# Patient Record
Sex: Male | Born: 1955 | Race: Black or African American | Hispanic: No | Marital: Married | Smoking: Former smoker
Health system: Southern US, Community
[De-identification: ages and names within clinical notes are randomized; demographics above are authoritative.]

## PROBLEM LIST (undated history)

## (undated) DIAGNOSIS — F528 Other sexual dysfunction not due to a substance or known physiological condition: Secondary | ICD-10-CM

## (undated) DIAGNOSIS — J45909 Unspecified asthma, uncomplicated: Secondary | ICD-10-CM

## (undated) DIAGNOSIS — E785 Hyperlipidemia, unspecified: Secondary | ICD-10-CM

## (undated) DIAGNOSIS — H409 Unspecified glaucoma: Secondary | ICD-10-CM

## (undated) DIAGNOSIS — I10 Essential (primary) hypertension: Secondary | ICD-10-CM

## (undated) HISTORY — DX: Unspecified glaucoma: H40.9

## (undated) HISTORY — DX: Essential (primary) hypertension: I10

## (undated) HISTORY — DX: Hyperlipidemia, unspecified: E78.5

## (undated) HISTORY — DX: Unspecified asthma, uncomplicated: J45.909

## (undated) HISTORY — PX: CATARACT EXTRACTION: SUR2

## (undated) HISTORY — DX: Other sexual dysfunction not due to a substance or known physiological condition: F52.8

---

## 1998-04-22 ENCOUNTER — Ambulatory Visit (HOSPITAL_COMMUNITY): Admission: RE | Admit: 1998-04-22 | Discharge: 1998-04-22 | Payer: Self-pay | Admitting: Internal Medicine

## 2006-02-26 ENCOUNTER — Inpatient Hospital Stay (HOSPITAL_COMMUNITY): Admission: EM | Admit: 2006-02-26 | Discharge: 2006-02-27 | Payer: Self-pay | Admitting: Emergency Medicine

## 2006-02-26 ENCOUNTER — Ambulatory Visit: Payer: Self-pay | Admitting: Internal Medicine

## 2006-03-05 ENCOUNTER — Ambulatory Visit: Payer: Self-pay | Admitting: Internal Medicine

## 2006-04-20 ENCOUNTER — Ambulatory Visit: Payer: Self-pay | Admitting: Internal Medicine

## 2006-04-20 LAB — CONVERTED CEMR LAB: PSA: 1.03 ng/mL

## 2006-04-27 ENCOUNTER — Ambulatory Visit: Payer: Self-pay | Admitting: Internal Medicine

## 2006-05-08 ENCOUNTER — Ambulatory Visit: Payer: Self-pay | Admitting: Internal Medicine

## 2006-06-13 ENCOUNTER — Ambulatory Visit: Payer: Self-pay | Admitting: Internal Medicine

## 2006-06-13 ENCOUNTER — Encounter (INDEPENDENT_AMBULATORY_CARE_PROVIDER_SITE_OTHER): Payer: Self-pay | Admitting: *Deleted

## 2006-07-22 ENCOUNTER — Emergency Department (HOSPITAL_COMMUNITY): Admission: EM | Admit: 2006-07-22 | Discharge: 2006-07-22 | Payer: Self-pay | Admitting: Family Medicine

## 2006-07-27 ENCOUNTER — Ambulatory Visit: Payer: Self-pay | Admitting: Internal Medicine

## 2007-04-11 ENCOUNTER — Ambulatory Visit: Payer: Self-pay | Admitting: Internal Medicine

## 2007-04-11 LAB — CONVERTED CEMR LAB
ALT: 24 units/L (ref 0–53)
AST: 29 units/L (ref 0–37)
Albumin: 3.9 g/dL (ref 3.5–5.2)
Basophils Relative: 0.4 % (ref 0.0–1.0)
Bilirubin Urine: NEGATIVE
CO2: 31 meq/L (ref 19–32)
Calcium: 9.6 mg/dL (ref 8.4–10.5)
Cholesterol: 159 mg/dL (ref 0–200)
Creatinine, Ser: 0.9 mg/dL (ref 0.4–1.5)
Eosinophils Absolute: 0.1 10*3/uL (ref 0.0–0.6)
GFR calc non Af Amer: 95 mL/min
HCT: 44.2 % (ref 39.0–52.0)
Hemoglobin, Urine: NEGATIVE
Ketones, ur: NEGATIVE mg/dL
LDL Cholesterol: 96 mg/dL (ref 0–99)
Leukocytes, UA: NEGATIVE
MCV: 85.7 fL (ref 78.0–100.0)
Monocytes Relative: 10.4 % (ref 3.0–11.0)
Neutro Abs: 1.6 10*3/uL (ref 1.4–7.7)
Nitrite: NEGATIVE
PSA: 4.83 ng/mL — ABNORMAL HIGH (ref 0.10–4.00)
Potassium: 4.4 meq/L (ref 3.5–5.1)
RDW: 12.1 % (ref 11.5–14.6)
Sodium: 143 meq/L (ref 135–145)
Total Bilirubin: 0.7 mg/dL (ref 0.3–1.2)
Total CHOL/HDL Ratio: 4
Total Protein: 6.7 g/dL (ref 6.0–8.3)
Urine Glucose: NEGATIVE mg/dL
VLDL: 23 mg/dL (ref 0–40)
pH: 6 (ref 5.0–8.0)

## 2007-04-12 ENCOUNTER — Encounter: Payer: Self-pay | Admitting: Internal Medicine

## 2007-04-12 DIAGNOSIS — M545 Low back pain, unspecified: Secondary | ICD-10-CM | POA: Insufficient documentation

## 2007-04-12 DIAGNOSIS — J45909 Unspecified asthma, uncomplicated: Secondary | ICD-10-CM | POA: Insufficient documentation

## 2007-04-12 DIAGNOSIS — H409 Unspecified glaucoma: Secondary | ICD-10-CM | POA: Insufficient documentation

## 2007-04-12 DIAGNOSIS — E785 Hyperlipidemia, unspecified: Secondary | ICD-10-CM | POA: Insufficient documentation

## 2007-04-12 DIAGNOSIS — F528 Other sexual dysfunction not due to a substance or known physiological condition: Secondary | ICD-10-CM

## 2007-04-12 HISTORY — DX: Unspecified asthma, uncomplicated: J45.909

## 2007-04-12 HISTORY — DX: Unspecified glaucoma: H40.9

## 2007-04-12 HISTORY — DX: Other sexual dysfunction not due to a substance or known physiological condition: F52.8

## 2007-04-12 HISTORY — DX: Hyperlipidemia, unspecified: E78.5

## 2008-05-28 ENCOUNTER — Ambulatory Visit: Payer: Self-pay | Admitting: Internal Medicine

## 2008-05-28 DIAGNOSIS — I10 Essential (primary) hypertension: Secondary | ICD-10-CM | POA: Insufficient documentation

## 2008-05-28 HISTORY — DX: Essential (primary) hypertension: I10

## 2008-06-22 ENCOUNTER — Ambulatory Visit: Payer: Self-pay | Admitting: Internal Medicine

## 2008-06-23 LAB — CONVERTED CEMR LAB
AST: 30 units/L (ref 0–37)
Alkaline Phosphatase: 57 units/L (ref 39–117)
Basophils Absolute: 0 10*3/uL (ref 0.0–0.1)
Basophils Relative: 0.7 % (ref 0.0–3.0)
Bilirubin Urine: NEGATIVE
Calcium: 8.9 mg/dL (ref 8.4–10.5)
Creatinine, Ser: 1.2 mg/dL (ref 0.4–1.5)
Eosinophils Absolute: 0.1 10*3/uL (ref 0.0–0.7)
Eosinophils Relative: 3 % (ref 0.0–5.0)
GFR calc Af Amer: 82 mL/min
GFR calc non Af Amer: 68 mL/min
Hemoglobin, Urine: NEGATIVE
Hemoglobin: 15.4 g/dL (ref 13.0–17.0)
Ketones, ur: NEGATIVE mg/dL
LDL Cholesterol: 108 mg/dL — ABNORMAL HIGH (ref 0–99)
Leukocytes, UA: NEGATIVE
MCHC: 34.6 g/dL (ref 30.0–36.0)
Neutro Abs: 2.2 10*3/uL (ref 1.4–7.7)
Nitrite: NEGATIVE
Potassium: 3.9 meq/L (ref 3.5–5.1)
RDW: 12.4 % (ref 11.5–14.6)
Total CHOL/HDL Ratio: 3.9
Total Protein: 6.2 g/dL (ref 6.0–8.3)
Urine Glucose: NEGATIVE mg/dL
Urobilinogen, UA: 0.2 (ref 0.0–1.0)
VLDL: 27 mg/dL (ref 0–40)

## 2009-06-15 ENCOUNTER — Ambulatory Visit: Payer: Self-pay | Admitting: Internal Medicine

## 2009-06-15 DIAGNOSIS — H6500 Acute serous otitis media, unspecified ear: Secondary | ICD-10-CM

## 2009-07-06 ENCOUNTER — Ambulatory Visit: Payer: Self-pay | Admitting: Internal Medicine

## 2009-07-06 LAB — CONVERTED CEMR LAB
ALT: 30 units/L (ref 0–53)
Alkaline Phosphatase: 84 units/L (ref 39–117)
Basophils Absolute: 0 10*3/uL (ref 0.0–0.1)
Bilirubin, Direct: 0 mg/dL (ref 0.0–0.3)
CO2: 30 meq/L (ref 19–32)
Calcium: 9.3 mg/dL (ref 8.4–10.5)
Chloride: 104 meq/L (ref 96–112)
Creatinine, Ser: 1 mg/dL (ref 0.4–1.5)
Glucose, Bld: 104 mg/dL — ABNORMAL HIGH (ref 70–99)
HCT: 46.5 % (ref 39.0–52.0)
HDL: 46.5 mg/dL (ref >39.00)
Hemoglobin, Urine: NEGATIVE
Ketones, ur: NEGATIVE mg/dL
Leukocytes, UA: NEGATIVE
Lymphs Abs: 1.4 10*3/uL (ref 0.7–4.0)
MCV: 89.1 fL (ref 78.0–100.0)
Monocytes Absolute: 0.5 10*3/uL (ref 0.1–1.0)
Monocytes Relative: 10.1 % (ref 3.0–12.0)
PSA: 2.49 ng/mL (ref 0.10–4.00)
Potassium: 4 meq/L (ref 3.5–5.1)
RDW: 12.6 % (ref 11.5–14.6)
Sodium: 141 meq/L (ref 135–145)
Specific Gravity, Urine: 1.02 (ref 1.000–1.030)
Total CHOL/HDL Ratio: 4
Total Protein, Urine: NEGATIVE mg/dL
Triglycerides: 71 mg/dL (ref 0.0–149.0)
VLDL: 14.2 mg/dL (ref 0.0–40.0)
WBC: 4.7 10*3/uL (ref 4.5–10.5)
pH: 5.5 (ref 5.0–8.0)

## 2009-07-13 ENCOUNTER — Ambulatory Visit: Payer: Self-pay | Admitting: Internal Medicine

## 2010-06-13 ENCOUNTER — Ambulatory Visit: Payer: Self-pay | Admitting: Internal Medicine

## 2010-06-13 DIAGNOSIS — M25559 Pain in unspecified hip: Secondary | ICD-10-CM

## 2010-06-13 LAB — CONVERTED CEMR LAB
ALT: 25 units/L (ref 0–53)
AST: 26 units/L (ref 0–37)
Alkaline Phosphatase: 72 units/L (ref 39–117)
Bilirubin, Direct: 0.1 mg/dL (ref 0.0–0.3)
CO2: 27 meq/L (ref 19–32)
Calcium: 9.4 mg/dL (ref 8.4–10.5)
Chloride: 104 meq/L (ref 96–112)
Cholesterol: 178 mg/dL (ref 0–200)
Eosinophils Absolute: 0.1 10*3/uL (ref 0.0–0.7)
Eosinophils Relative: 2.6 % (ref 0.0–5.0)
GFR calc non Af Amer: 104.72 mL/min (ref >60)
HDL: 48.2 mg/dL (ref >39.00)
Hemoglobin: 16 g/dL (ref 13.0–17.0)
MCHC: 34.4 g/dL (ref 30.0–36.0)
MCV: 86.7 fL (ref 78.0–100.0)
Monocytes Absolute: 0.6 10*3/uL (ref 0.1–1.0)
Neutro Abs: 2.1 10*3/uL (ref 1.4–7.7)
Nitrite: NEGATIVE
PSA: 2.68 ng/mL (ref 0.10–4.00)
Platelets: 196 10*3/uL (ref 150.0–400.0)
TSH: 1.11 microintl units/mL (ref 0.35–5.50)
Total CHOL/HDL Ratio: 4
Total Protein, Urine: NEGATIVE mg/dL
Total Protein: 7.1 g/dL (ref 6.0–8.3)
WBC: 4.8 10*3/uL (ref 4.5–10.5)
pH: 6 (ref 5.0–8.0)

## 2010-09-22 NOTE — Assessment & Plan Note (Signed)
Summary: OV--PER PT D/T---STC   Vital Signs:  Patient profile:   55 year old male Height:      71.5 inches Weight:      232.50 pounds BMI:     32.09 O2 Sat:      97 % on Room air Temp:     98.5 degrees F oral Pulse rate:   74 / minute BP sitting:   150 / 88  (left arm) Cuff size:   large  Vitals Entered By: Zella Ball Ewing CMA Duncan Dull) (June 13, 2010 10:27 AM)  O2 Flow:  Room air  Preventive Care Screening  Colonoscopy:    Next Due:  06/2011     decline flu shot  CC: followup/RE   CC:  followup/RE.  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  out of BP meds for 2 wks;  overall gained 5 lbs in 2 yrs;  Pt denies CP, worsening sob, doe, wheezing, orthopnea, pnd, worsening LE edema, palps, dizziness or syncope  Pt denies new neuro symptoms such as headache, facial or extremity weakness  No fever, wt loss, night sweats, loss of appetite or other constitutional symptoms  Pt denies polydipsia, polyuria, Overall good compliance with meds, trying to follow low chol  diet, wt stable, little excercise however , except for work. Pt states good ability with ADL's, low fall risk, home safety reviewed and adequate, no significant change in hearing or vision, trying to follow lower chol diet, and occasionally active only with regular excercise.   Preventive Screening-Counseling & Management      Drug Use:  no.    Problems Prior to Update: 1)  Hip Pain, Left  (ICD-719.45) 2)  Preventive Health Care  (ICD-V70.0) 3)  Otitis Media, Serous, Acute, Right  (ICD-381.01) 4)  Hypertension  (ICD-401.9) 5)  Low Back Pain  (ICD-724.2) 6)  Hyperlipidemia  (ICD-272.4) 7)  Asthma  (ICD-493.90) 8)  Erectile Dysfunction  (ICD-302.72) 9)  Glaucoma Nos  (ICD-365.9)  Medications Prior to Update: 1)   Amlodipine Besylate 10 Mg Tabs (Amlodipine Besylate) .Marland Kitchen.. 1 By Mouth Once Daily 2)  Adult Aspirin Ec Low Strength 81 Mg Tbec (Aspirin) .Marland Kitchen.. 1 By Mouth Once Daily 3)  Viagra 100 Mg Tabs (Sildenafil Citrate) .Marland Kitchen.. 1 By Mouth Once Daily As Needed  Current Medications (verified): 1)  Amlodipine Besylate 10 Mg Tabs (Amlodipine Besylate) .Marland Kitchen.. 1 By Mouth Once Daily 2)  Adult Aspirin Ec Low Strength 81 Mg Tbec (Aspirin) .Marland Kitchen.. 1 By Mouth Once Daily 3)  Levitra 20 Mg Tabs (Vardenafil Hcl) .Marland Kitchen.. 1 By Mouth Every Other Day As Needed 4)  Tramadol Hcl 50 Mg Tabs (Tramadol Hcl) .Marland Kitchen.. 1 - 2 By Mouth Q 6 Hrs As Needed Pain  Allergies (verified): 1)  ! Pcn  Past History:  Past Medical History: Last updated: 05/28/2008 Glaucoma Erectile Dysfunction Asthma Hyperlipidemia Low back pain Hypertension  Past Surgical History: Last updated: 04/12/2007 L Lower Leg L Hip REpair- Pin Placement Eye Surgery X3  Family History: Last updated: 07/13/2009 mother with breast cancer grandfather with bleeding ulcers  Social History: Last updated: 06/13/2010 Married Alcohol use-yes Former Smoker 2 biological children work - Oncologist  - Psychologist, sport and exercise Drug use-no  Risk Factors: Smoking Status: quit (07/13/2009) Packs/Day: 1/2 PPD (  04/12/2007)  Family History: Reviewed history from 07/13/2009 and no changes required. mother with breast cancer grandfather with bleeding ulcers  Social History: Reviewed history from 07/13/2009 and no changes required. Married Alcohol use-yes Former Smoker 2 biological children work - Tax adviser Drug use-no Drug Use:  no  Review of Systems  The patient denies anorexia, fever, vision loss, decreased hearing, hoarseness, chest pain, syncope, dyspnea on exertion, peripheral edema, prolonged cough, headaches, hemoptysis, abdominal pain, melena, hematochezia, severe indigestion/heartburn, hematuria, muscle weakness, suspicious skin  lesions, transient blindness, difficulty walking, depression, unusual weight change, abnormal bleeding, enlarged lymph nodes, and angioedema.         all otherwise negative per pt -    Physical Exam  General:  alert and overweight-appearing.   Head:  normocephalic and atraumatic.   Eyes:  vision grossly intact, pupils equal, and pupils round.   Ears:  R ear normal and L ear normal.   Nose:  no external deformity and no nasal discharge.   Mouth:  no gingival abnormalities and pharynx pink and moist.  Neck:  supple and no masses.   Lungs:  normal respiratory effort and normal breath sounds.   Heart:  normal rate and regular rhythm.   Abdomen:  soft, non-tender, and normal bowel sounds.   Msk:  no joint tenderness and no joint swelling.  , has pain to ROM left hip testing Extremities:  no edema, no erythema  Neurologic:  cranial nerves II-XII intact and strength normal in all extremities.  , gait antagic favoring the left leg Skin:  color normal and no rashes.   Psych:  not anxious appearing and not depressed appearing.     Impression & Recommendations:  Problem # 1:  PREVENTIVE HEALTH CARE (ICD-V70.0) Overall doing well, age appropriate education and counseling updated, referral for preventive services and immunizations addressed, dietary counseling and smoking status adressed , most recent labs reviewed, ecg reviewed or declined I have personally reviewed and have noted 1.The patient's medical and social history 2.Their use of alcohol, tobacco or illicit drugs 3.Their current medications and supplements 4. Functional ability including ADL's, fall risk, home safety risk, hearing & visual impairment  5.Diet and physical activities 6.Evidence for depression or mood disorders The patients weight, height, BMI  have been recorded in the chart I have made referrals, counseling and provided education to the patient based review of the above  Orders: TLB-BMP (Basic Metabolic Panel-BMET)  (80048-METABOL) TLB-CBC Platelet - w/Differential (85025-CBCD) TLB-Hepatic/Liver Function Pnl (80076-HEPATIC) TLB-Lipid Panel (80061-LIPID) TLB-PSA (Prostate Specific Antigen) (84153-PSA) TLB-TSH (Thyroid Stimulating Hormone) (84443-TSH) TLB-Udip ONLY (81003-UDIP)  Problem # 2:  HIP PAIN, LEFT (ICD-719.45)  His updated medication list for this problem includes:    Adult Aspirin Ec Low Strength 81 Mg Tbec (Aspirin) .Marland Kitchen... 1 by mouth once daily    Tramadol Hcl 50 Mg Tabs (Tramadol hcl) .Marland Kitchen... 1 - 2 by mouth q 6 hrs as needed pain and prox left ant thigh pain and hx of prior surgury/pins;  incresaed pain recently - for ortho f/u (has seen [place] [facility] in yrs past), pain med refill  Orders: Orthopedic Surgeon Referral (Ortho Surgeon)  Problem # 3:  HYPERTENSION (ICD-401.9)  His updated medication list for this problem includes:    Amlodipine Besylate 10 Mg Tabs (Amlodipine besylate) .Marland Kitchen... 1 by mouth once daily to re-start med, f/u BP at home and next visit  BP today: 150/88 Prior BP: 122/76 (07/13/2009)  Labs Reviewed: K+: 4.0 (07/06/2009) Creat: : 1.0 (  07/06/2009)   Chol: 186 (07/06/2009)   HDL: 46.50 (07/06/2009)   LDL: 125 (07/06/2009)   TG: 71.0 (07/06/2009)  Problem # 4:  ERECTILE DYSFUNCTION (ICD-302.72)  His updated medication list for this problem includes:    Levitra 20 Mg Tabs (Vardenafil hcl) .Marland Kitchen... 1 by mouth every other day as needed treat as above, f/u any worsening signs or symptoms , change to levitra due to cost  Complete Medication List: 1)  Amlodipine Besylate 10 Mg Tabs (Amlodipine besylate) .Marland Kitchen.. 1 by mouth once daily 2)  Adult Aspirin Ec Low Strength 81 Mg Tbec (Aspirin) .Marland Kitchen.. 1 by mouth once daily 3)  Levitra 20 Mg Tabs (Vardenafil hcl) .Marland Kitchen.. 1 by mouth every other day as needed 4)  Tramadol Hcl 50 Mg Tabs (Tramadol hcl) .Marland Kitchen.. 1 - 2 by mouth q 6 hrs as needed pain  Patient Instructions: 1)  Please take all new medications as prescribed - the tramadol for pain, and  levitra (in place of viagra) 2)  Continue all previous medications as before this visit , including re-starting the blood pressure medicine 3)  Please go to the Lab in the basement for your blood and/or urine tests today 4)  Please call the number on the [facility] Card for results of your testing  5)  You will be contacted about the referral(s) to: Orthopedic 6)  Please schedule a follow-up appointment in 1 year, or sooner if needed 7)  Check your Blood Pressure regularly. If it is above 140/90: you should make an appointment, sooner Prescriptions: TRAMADOL HCL 50 MG TABS (TRAMADOL HCL) 1 - 2 by mouth q 6 hrs as needed pain  #240 x 1   Entered and Authorized by:   Corwin Levins MD   Signed by:   Corwin Levins MD on 06/13/2010   Method used:   Print then Give to Patient   RxID:   [id number] AMLODIPINE BESYLATE 10 MG TABS (AMLODIPINE BESYLATE) 1 by mouth once daily  #90 x 3   Entered and Authorized by:   Corwin Levins MD   Signed by:   Corwin Levins MD on 06/13/2010   Method used:   Print then Give to Patient   RxID:   [id number] LEVITRA 20 MG TABS (VARDENAFIL HCL) 1 by mouth every other day as needed  #5 x 11   Entered and Authorized by:   Corwin Levins MD   Signed by:   Corwin Levins MD on 06/13/2010   Method used:   Print then Give to Patient   RxID:   [id number]    Orders Added: 1)  TLB-BMP (Basic Metabolic Panel-BMET) [80048-METABOL] 2)  TLB-CBC Platelet - w/Differential [85025-CBCD] 3)  TLB-Hepatic/Liver Function Pnl [80076-HEPATIC] 4)  TLB-Lipid Panel [80061-LIPID] 5)  TLB-PSA (Prostate Specific Antigen) [66440-HKV] 6)  TLB-TSH (Thyroid Stimulating Hormone) [84443-TSH] 7)  TLB-Udip ONLY [81003-UDIP] 8)  Orthopedic Surgeon Referral [Ortho Surgeon] 9)  Est. Patient 40-64 years [phone]

## 2011-01-06 NOTE — Assessment & Plan Note (Signed)
[facility]                                 ON-CALL NOTE   Kent Thomas, Kent Thomas                     MRN:          [id number]  DATE:07/22/2006                            DOB:          11/01/1955    SUBJECTIVE:  [name] has had a couple of drops of blood in his urine  since Saturday, again recurring today, at the end of his urine stream.  He states it also burns when he urinates.  He denies any fever, chills,  nausea, vomiting.   ASSESSMENT/PLAN:  Possible urinary tract infection.  Recommended  appointment with primary care doctor on Monday.     Kerby Nora, MD  Electronically Signed    AB/MedQ  DD: 07/22/2006  DT: 07/23/2006  Job #: [id number]

## 2011-01-06 NOTE — Assessment & Plan Note (Signed)
[facility]                                 ON-CALL NOTE   ERRIN, CHEWNING                     MRN:          [id number]  DATE:07/22/2006                            DOB:          01-02-1956    Mr. Boehringer called because he started having painless hematuria. He had a  colonoscopy one month ago and wondered if it was related to that. He  does not have a fever or feel unwell otherwise.   I recommended that he call to schedule an appointment with Dr. Oliver Barre, his primary care physician when the office opens on Monday. I told  him this is abnormal but that it is extremely unlikely it is related to  his colonoscopy a month ago.     Iva Boop, MD,FACG  Electronically Signed    CEG/MedQ  DD: 07/22/2006  DT: 07/23/2006  Job #: [id number]   cc:   Corwin Levins, MD
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
## 2011-06-14 ENCOUNTER — Encounter: Payer: Self-pay | Admitting: Internal Medicine

## 2011-07-10 ENCOUNTER — Encounter: Payer: Self-pay | Admitting: Internal Medicine

## 2011-07-10 ENCOUNTER — Telehealth: Payer: Self-pay

## 2011-07-10 ENCOUNTER — Other Ambulatory Visit (INDEPENDENT_AMBULATORY_CARE_PROVIDER_SITE_OTHER): Payer: Self-pay

## 2011-07-10 DIAGNOSIS — Z0000 Encounter for general adult medical examination without abnormal findings: Secondary | ICD-10-CM | POA: Insufficient documentation

## 2011-07-10 DIAGNOSIS — Z1289 Encounter for screening for malignant neoplasm of other sites: Secondary | ICD-10-CM

## 2011-07-10 LAB — CBC WITH DIFFERENTIAL/PLATELET
Basophils Relative: 0.6 % (ref 0.0–3.0)
Eosinophils Relative: 2.3 % (ref 0.0–5.0)
Hemoglobin: 15 g/dL (ref 13.0–17.0)
Lymphs Abs: 1.4 10*3/uL (ref 0.7–4.0)
MCHC: 33 g/dL (ref 30.0–36.0)
MCV: 87.6 fl (ref 78.0–100.0)
Monocytes Relative: 10.2 % (ref 3.0–12.0)
Neutrophils Relative %: 57.1 % (ref 43.0–77.0)
Platelets: 200 10*3/uL (ref 150.0–400.0)
RDW: 13.6 % (ref 11.5–14.6)
WBC: 4.7 10*3/uL (ref 4.5–10.5)

## 2011-07-10 LAB — URINALYSIS, ROUTINE W REFLEX MICROSCOPIC
Bilirubin Urine: NEGATIVE
Ketones, ur: NEGATIVE
Leukocytes, UA: NEGATIVE
Total Protein, Urine: NEGATIVE
Urine Glucose: NEGATIVE
pH: 6 (ref 5.0–8.0)

## 2011-07-10 LAB — HEPATIC FUNCTION PANEL
AST: 23 U/L (ref 0–37)
Albumin: 4 g/dL (ref 3.5–5.2)
Total Bilirubin: 0.7 mg/dL (ref 0.3–1.2)

## 2011-07-10 LAB — LIPID PANEL
Cholesterol: 161 mg/dL (ref 0–200)
HDL: 47.7 mg/dL (ref >39.00)
LDL Cholesterol: 97 mg/dL (ref 0–99)
Total CHOL/HDL Ratio: 3
Triglycerides: 80 mg/dL (ref 0.0–149.0)
VLDL: 16 mg/dL (ref 0.0–40.0)

## 2011-07-10 LAB — BASIC METABOLIC PANEL
Calcium: 9.1 mg/dL (ref 8.4–10.5)
Creatinine, Ser: 0.9 mg/dL (ref 0.4–1.5)
Potassium: 4.2 mEq/L (ref 3.5–5.1)
Sodium: 140 mEq/L (ref 135–145)

## 2011-07-10 LAB — PSA: PSA: 2.47 ng/mL (ref 0.10–4.00)

## 2011-07-10 NOTE — Telephone Encounter (Signed)
Put order in for physical labs. 

## 2011-07-11 ENCOUNTER — Encounter: Payer: Self-pay | Admitting: Internal Medicine

## 2011-07-11 ENCOUNTER — Ambulatory Visit (INDEPENDENT_AMBULATORY_CARE_PROVIDER_SITE_OTHER): Payer: 59 | Admitting: Internal Medicine

## 2011-07-11 VITALS — BP 142/70 | HR 73 | Temp 97.9°F | Ht 71.0 in | Wt 225.2 lb

## 2011-07-11 DIAGNOSIS — E785 Hyperlipidemia, unspecified: Secondary | ICD-10-CM

## 2011-07-11 DIAGNOSIS — Z23 Encounter for immunization: Secondary | ICD-10-CM

## 2011-07-11 DIAGNOSIS — J45909 Unspecified asthma, uncomplicated: Secondary | ICD-10-CM

## 2011-07-11 DIAGNOSIS — Z0000 Encounter for general adult medical examination without abnormal findings: Secondary | ICD-10-CM

## 2011-07-11 DIAGNOSIS — M25552 Pain in left hip: Secondary | ICD-10-CM | POA: Insufficient documentation

## 2011-07-11 DIAGNOSIS — J209 Acute bronchitis, unspecified: Secondary | ICD-10-CM

## 2011-07-11 DIAGNOSIS — I10 Essential (primary) hypertension: Secondary | ICD-10-CM

## 2011-07-11 DIAGNOSIS — M25559 Pain in unspecified hip: Secondary | ICD-10-CM

## 2011-07-11 MED ORDER — AZITHROMYCIN 250 MG PO TABS: ORAL_TABLET | ORAL | Status: AC

## 2011-07-11 MED ORDER — VARDENAFIL HCL 20 MG PO TABS: 20.0000 mg | ORAL_TABLET | Freq: Every day | ORAL | Status: DC | PRN

## 2011-07-11 MED ORDER — TRAMADOL HCL 50 MG PO TABS: 50.0000 mg | ORAL_TABLET | Freq: Four times a day (QID) | ORAL | Status: DC | PRN

## 2011-07-11 MED ORDER — AMLODIPINE BESYLATE 10 MG PO TABS: 10.0000 mg | ORAL_TABLET | Freq: Every day | ORAL | Status: DC

## 2011-07-11 MED ORDER — BENZONATATE 100 MG PO CAPS: ORAL_CAPSULE | ORAL | Status: DC

## 2011-07-11 NOTE — Patient Instructions (Addendum)
You had the flu shot today Your medications were refilled today You will be contacted regarding the referral for: colonoscopy, and orthopedic ([facility] on [address]) Take all new medications as prescribed - the antibiotic and cough pills Continue all other medications as before Please return in 1 year for your yearly visit, or sooner if needed, with Lab testing done 3-5 days before

## 2011-07-11 NOTE — Assessment & Plan Note (Signed)

## 2011-07-16 ENCOUNTER — Encounter: Payer: Self-pay | Admitting: Internal Medicine

## 2011-07-16 NOTE — Assessment & Plan Note (Signed)
stable overall by hx and exam, most recent data reviewed with pt, and pt to continue medical treatment as before  Lab Results  Component Value Date   LDLCALC 97 07/10/2011

## 2011-07-16 NOTE — Assessment & Plan Note (Signed)
stable overall by hx and exam, most recent data reviewed with pt, and pt to continue medical treatment as before  SpO2 Readings from Last 3 Encounters:  07/11/11 95%  06/13/10 97%  07/13/09 97%

## 2011-07-16 NOTE — Assessment & Plan Note (Signed)
stable overall by hx and exam, most recent data reviewed with pt, and pt to continue medical treatment as before  BP Readings from Last 3 Encounters:  07/11/11 142/70  06/13/10 150/88  07/13/09 122/76

## 2011-07-16 NOTE — Progress Notes (Signed)
Subjective:    Patient ID: Kent Thomas, male    DOB: July 22, 1956, 55 y.o.   MRN: [id number]  HPI  Here for wellness and f/u;  Overall doing ok;  Pt denies CP, worsening SOB, DOE, wheezing, orthopnea, PND, worsening LE edema, palpitations, dizziness or syncope.  Pt denies neurological change such as new Headache, facial or extremity weakness.  Pt denies polydipsia, polyuria, or low sugar symptoms. Pt states overall good compliance with treatment and medications, good tolerability, and trying to follow lower cholesterol diet.  Pt denies worsening depressive symptoms, suicidal ideation or panic. No fever, wt loss, night sweats, loss of appetite, or other constitutional symptoms.  Pt states good ability with ADL's, low fall risk, home safety reviewed and adequate, no significant changes in hearing or vision, and occasionally active with exercise.  Overall doing well, needs med refills, for flu shot refill today.  Here with acute onset mild to mod 2-3 days ST, HA, general weakness and malaise, with prod cough greenish sputum.  Also with acute on chronic recurring left hip pain, mild worse now moderate, without back or more distal leg pain, not worse to lie on the side, nothing makes better. Past Medical History  Diagnosis Date  . HYPERTENSION 05/28/2008  . HYPERLIPIDEMIA 04/12/2007  . ASTHMA 04/12/2007  . GLAUCOMA NOS 04/12/2007  . ERECTILE DYSFUNCTION 04/12/2007   No past surgical history on file.  reports that he has quit smoking. He does not have any smokeless tobacco history on file. His alcohol and drug histories not on file. family history is not on file. Allergies  Allergen Reactions  . Penicillins    No current outpatient prescriptions on file prior to visit.   Review of Systems Review of Systems  Constitutional: Negative for diaphoresis, activity change, appetite change and unexpected weight change.  HENT: Negative for hearing loss, ear pain, facial swelling, mouth sores and neck  stiffness.   Eyes: Negative for pain, redness and visual disturbance.  Respiratory: Negative for shortness of breath and wheezing.   Cardiovascular: Negative for chest pain and palpitations.  Gastrointestinal: Negative for diarrhea, blood in stool, abdominal distention and rectal pain.  Genitourinary: Negative for hematuria, flank pain and decreased urine volume.  Musculoskeletal: Negative for myalgias and joint swelling.  Skin: Negative for color change and wound.  Neurological: Negative for syncope and numbness.  Hematological: Negative for adenopathy.  Psychiatric/Behavioral: Negative for hallucinations, self-injury, decreased concentration and agitation.      Objective:   Physical Exam BP 142/70  Pulse 73  Temp(Src) 97.9 F (36.6 C) (Oral)  Ht 5\' 11"  (1.803 m)  Wt 225 lb 4 oz (102.173 kg)  BMI 31.42 kg/m2  SpO2 95% Physical Exam  VS noted, mild ill Constitutional: Pt is oriented to person, place, and time. Appears well-developed and well-nourished.  HENT:  Head: Normocephalic and atraumatic.  Right Ear: External ear normal.  Left Ear: External ear normal.  Nose: Nose normal.  Mouth/Throat: Oropharynx is clear and moist. Bilat tm's mild erythema.  Sinus nontender.  Pharynx mild erythema  Eyes: Conjunctivae and EOM are normal. Pupils are equal, round, and reactive to light.  Neck: Normal range of motion. Neck supple. No JVD present. No tracheal deviation present.  Cardiovascular: Normal rate, regular rhythm, normal heart sounds and intact distal pulses.   Pulmonary/Chest: Effort normal and breath sounds normal.  Abdominal: Soft. Bowel sounds are normal. There is no tenderness.  Musculoskeletal: Normal range of motion. Exhibits no edema.  Lymphadenopathy:  Has no cervical adenopathy.  Neurological: Pt is alert and oriented to person, place, and time. Pt has normal reflexes. No cranial nerve deficit.  Skin: Skin is warm and dry. No rash noted.  Psychiatric:  Has  normal mood  and affect. Behavior is normal.  Left hip with decreased ROM with pain on flexion    Assessment & Plan:

## 2011-07-16 NOTE — Assessment & Plan Note (Addendum)
Chronic recurring s/p left hip surgury 1960's, for tramadol refill, o/w mild increased and will need orthopedic evaluation/followup

## 2011-07-16 NOTE — Assessment & Plan Note (Signed)
Mild to mod, for antibx course,  to f/u any worsening symptoms or concerns 

## 2011-10-05 ENCOUNTER — Encounter: Payer: Self-pay | Admitting: Gastroenterology

## 2011-10-31 ENCOUNTER — Other Ambulatory Visit: Payer: Self-pay | Admitting: Sports Medicine

## 2011-10-31 DIAGNOSIS — M169 Osteoarthritis of hip, unspecified: Secondary | ICD-10-CM

## 2011-11-01 ENCOUNTER — Ambulatory Visit
Admission: RE | Admit: 2011-11-01 | Discharge: 2011-11-01 | Disposition: A | Discharge status: Home / Self Care | Payer: 59 | Source: Ambulatory Visit | Attending: Sports Medicine | Admitting: Sports Medicine

## 2011-11-01 DIAGNOSIS — M169 Osteoarthritis of hip, unspecified: Secondary | ICD-10-CM

## 2011-11-01 MED ORDER — METHYLPREDNISOLONE ACETATE 40 MG/ML INJ SUSP (RADIOLOG
120.0000 mg | Freq: Once | INTRAMUSCULAR | Status: AC
  Administered 2011-11-01: 120 mg via INTRA_ARTICULAR

## 2011-11-01 MED ORDER — IOHEXOL 180 MG/ML  SOLN
1.0000 mL | Freq: Once | INTRAMUSCULAR | Status: AC | PRN
  Administered 2011-11-01: 1 mL via INTRA_ARTICULAR

## 2012-02-21 ENCOUNTER — Ambulatory Visit: Payer: 59 | Admitting: Internal Medicine

## 2012-02-21 DIAGNOSIS — Z0289 Encounter for other administrative examinations: Secondary | ICD-10-CM

## 2012-04-01 ENCOUNTER — Other Ambulatory Visit: Payer: Self-pay | Admitting: Internal Medicine

## 2012-04-01 NOTE — Telephone Encounter (Signed)
Done erx 

## 2012-05-17 ENCOUNTER — Encounter: Payer: Self-pay | Admitting: Internal Medicine

## 2012-08-28 ENCOUNTER — Other Ambulatory Visit: Payer: Self-pay | Admitting: Internal Medicine

## 2012-08-28 NOTE — Telephone Encounter (Signed)
Done erx  Due for ROV for further refills - robin to let pt know

## 2012-09-21 HISTORY — PX: HAMMER TOE SURGERY: SHX385

## 2012-10-14 ENCOUNTER — Ambulatory Visit (INDEPENDENT_AMBULATORY_CARE_PROVIDER_SITE_OTHER): Payer: 59 | Admitting: Internal Medicine

## 2012-10-14 ENCOUNTER — Encounter: Payer: Self-pay | Admitting: Internal Medicine

## 2012-10-14 ENCOUNTER — Other Ambulatory Visit (INDEPENDENT_AMBULATORY_CARE_PROVIDER_SITE_OTHER): Payer: 59

## 2012-10-14 VITALS — BP 134/64 | HR 83 | Temp 98.7°F | Ht 71.0 in | Wt 221.2 lb

## 2012-10-14 DIAGNOSIS — Z0000 Encounter for general adult medical examination without abnormal findings: Secondary | ICD-10-CM

## 2012-10-14 DIAGNOSIS — I10 Essential (primary) hypertension: Secondary | ICD-10-CM

## 2012-10-14 LAB — CBC WITH DIFFERENTIAL/PLATELET
Basophils Absolute: 0 10*3/uL (ref 0.0–0.1)
Eosinophils Absolute: 0.1 10*3/uL (ref 0.0–0.7)
Hemoglobin: 15.4 g/dL (ref 13.0–17.0)
Lymphocytes Relative: 26.7 % (ref 12.0–46.0)
MCHC: 33.7 g/dL (ref 30.0–36.0)
Neutro Abs: 3.2 10*3/uL (ref 1.4–7.7)
Neutrophils Relative %: 63.2 % (ref 43.0–77.0)
RDW: 13.5 % (ref 11.5–14.6)

## 2012-10-14 LAB — BASIC METABOLIC PANEL
CO2: 29 mEq/L (ref 19–32)
Chloride: 105 mEq/L (ref 96–112)
Potassium: 4.1 mEq/L (ref 3.5–5.1)
Sodium: 139 mEq/L (ref 135–145)

## 2012-10-14 LAB — LIPID PANEL: Total CHOL/HDL Ratio: 3

## 2012-10-14 LAB — HEPATIC FUNCTION PANEL
ALT: 29 U/L (ref 0–53)
Alkaline Phosphatase: 76 U/L (ref 39–117)
Bilirubin, Direct: 0.1 mg/dL (ref 0.0–0.3)
Total Protein: 6.8 g/dL (ref 6.0–8.3)

## 2012-10-14 LAB — URINALYSIS, ROUTINE W REFLEX MICROSCOPIC
Bilirubin Urine: NEGATIVE
Hgb urine dipstick: NEGATIVE
Urine Glucose: NEGATIVE
Urobilinogen, UA: 0.2 (ref 0.0–1.0)

## 2012-10-14 MED ORDER — TRAMADOL HCL 50 MG PO TABS: 50.0000 mg | ORAL_TABLET | Freq: Four times a day (QID) | ORAL | Status: DC | PRN

## 2012-10-14 MED ORDER — AMLODIPINE BESYLATE 10 MG PO TABS: 10.0000 mg | ORAL_TABLET | Freq: Every day | ORAL | Status: DC

## 2012-10-14 MED ORDER — TADALAFIL 20 MG PO TABS: 20.0000 mg | ORAL_TABLET | Freq: Every day | ORAL | Status: DC | PRN

## 2012-10-14 NOTE — Patient Instructions (Addendum)
Please continue all other medications as before, and refills have been done if requested. Please take all new medication as prescribed - the cialis (and you can print out a coupon for the free 3 online) Please have the pharmacy call with any other refills you may need. Please continue your efforts at being more active, low cholesterol diet, and weight control. Please go to the LAB in the Basement (turn left off the elevator) for the tests to be done today You will be contacted by phone if any changes need to be made immediately.  Otherwise, you will receive a letter about your results with an explanation, but please check with MyChart first. You will be contacted regarding the referral for: colonoscopy You are otherwise up to date with prevention measures today. Thank you for enrolling in MyChart. Please follow the instructions below to securely access your online medical record. MyChart allows you to send messages to your doctor, view your test results, renew your prescriptions, schedule appointments, and more. To Log into My Chart online, please go by [employer] or [employer] to [email or web address], or download the MyChart App from the [employer] of [employer] .  Your Username is:  [email or web address] (pass [email or web address]) Please send a practice Message on Mychart later today. Please return in 1 year for your yearly visit, or sooner if needed, with Lab testing done 3-5 days before

## 2012-10-14 NOTE — Progress Notes (Signed)
Subjective:    Patient ID: Kent Thomas, male    DOB: Jul 08, 1956, 57 y.o.   MRN: [id number]  HPI  Here for wellness and f/u;  Overall doing ok;  Pt denies CP, worsening SOB, DOE, wheezing, orthopnea, PND, worsening LE edema, palpitations, dizziness or syncope.  Pt denies neurological change such as new headache, facial or extremity weakness.  Pt denies polydipsia, polyuria, or low sugar symptoms. Pt states overall good compliance with treatment and medications, good tolerability, and has been trying to follow lower cholesterol diet.  Pt denies worsening depressive symptoms, suicidal ideation or panic. No fever, night sweats, wt loss, loss of appetite, or other constitutional symptoms.  Pt states good ability with ADL's, has low fall risk, home safety reviewed and adequate, no other significant changes in hearing or vision, and only occasionally active with exercise.  Having right foot surgury tomorrow/bunion and hammertoe. Past Medical History  Diagnosis Date  . HYPERTENSION 05/28/2008  . HYPERLIPIDEMIA 04/12/2007  . ASTHMA 04/12/2007  . GLAUCOMA NOS 04/12/2007  . ERECTILE DYSFUNCTION 04/12/2007   No past surgical history on file.  reports that he has quit smoking. He does not have any smokeless tobacco history on file. His alcohol and drug histories are not on file. family history is not on file. Allergies  Allergen Reactions  . Penicillins    Current Outpatient Prescriptions on File Prior to Visit  Medication Sig Dispense Refill  . aspirin 81 MG tablet Take 81 mg by mouth daily.         No current facility-administered medications on file prior to visit.    Review of Systems Constitutional: Negative for diaphoresis, activity change, appetite change or unexpected weight change.  HENT: Negative for hearing loss, ear pain, facial swelling, mouth sores and neck stiffness.   Eyes: Negative for pain, redness and visual disturbance.  Respiratory: Negative for shortness of breath and wheezing.    Cardiovascular: Negative for chest pain and palpitations.  Gastrointestinal: Negative for diarrhea, blood in stool, abdominal distention or other pain Genitourinary: Negative for hematuria, flank pain or change in urine volume.  Musculoskeletal: Negative for myalgias and joint swelling.  Skin: Negative for color change and wound.  Neurological: Negative for syncope and numbness. other than noted Hematological: Negative for adenopathy.  Psychiatric/Behavioral: Negative for hallucinations, self-injury, decreased concentration and agitation.      Objective:   Physical Exam BP 134/64  Pulse 83  Temp(Src) 98.7 F (37.1 C) (Oral)  Ht 5\' 11"  (1.803 m)  Wt 221 lb 4 oz (100.358 kg)  BMI 30.87 kg/m2  SpO2 99% VS noted,  Constitutional: Pt is oriented to person, place, and time. Appears well-developed and well-nourished.  Head: Normocephalic and atraumatic.  Right Ear: External ear normal.  Left Ear: External ear normal.  Nose: Nose normal.  Mouth/Throat: Oropharynx is clear and moist.  Eyes: Conjunctivae and EOM are normal. Pupils are equal, round, and reactive to light.  Neck: Normal range of motion. Neck supple. No JVD present. No tracheal deviation present.  Cardiovascular: Normal rate, regular rhythm, normal heart sounds and intact distal pulses.   Pulmonary/Chest: Effort normal and breath sounds normal.  Abdominal: Soft. Bowel sounds are normal. There is no tenderness. No HSM  Musculoskeletal: Normal range of motion. Exhibits no edema.  Lymphadenopathy:  Has no cervical adenopathy.  Neurological: Pt is alert and oriented to person, place, and time. Pt has normal reflexes. No cranial nerve deficit.  Skin: Skin is warm and dry. No rash noted.  Psychiatric:  Has  normal mood and affect. Behavior is normal.     Assessment & Plan:

## 2012-10-14 NOTE — Assessment & Plan Note (Signed)
ECG reviewed as per emr, stable overall by history and exam, recent data reviewed with pt, and pt to continue medical treatment as before,  to f/u any worsening symptoms or concerns BP Readings from Last 3 Encounters:  10/14/12 134/64  07/11/11 142/70  06/13/10 150/88

## 2012-10-14 NOTE — Assessment & Plan Note (Signed)

## 2012-10-31 ENCOUNTER — Other Ambulatory Visit: Payer: Self-pay | Admitting: Internal Medicine

## 2012-10-31 NOTE — Telephone Encounter (Signed)
Tramadol -too soon

## 2012-12-09 ENCOUNTER — Other Ambulatory Visit: Payer: Self-pay | Admitting: Internal Medicine

## 2013-04-05 ENCOUNTER — Other Ambulatory Visit: Payer: Self-pay | Admitting: Internal Medicine

## 2013-04-07 NOTE — Telephone Encounter (Signed)
Faxed hardcopy to [facility] [place]

## 2013-04-07 NOTE — Telephone Encounter (Signed)
Done hardcopy to [facility]  

## 2013-04-16 ENCOUNTER — Telehealth: Payer: Self-pay

## 2013-04-16 MED ORDER — TRAMADOL HCL 50 MG PO TABS: ORAL_TABLET | ORAL | Status: DC

## 2013-04-16 NOTE — Telephone Encounter (Signed)
Patient called [facility] stating that he has lost his rx for tramadol and would like a new rx sent for replacement. Thanks

## 2013-04-16 NOTE — Telephone Encounter (Signed)
Ok this time only 

## 2013-04-17 ENCOUNTER — Other Ambulatory Visit: Payer: Self-pay | Admitting: *Deleted

## 2013-04-17 MED ORDER — TADALAFIL 20 MG PO TABS: 20.0000 mg | ORAL_TABLET | Freq: Every day | ORAL | Status: DC | PRN

## 2013-04-17 NOTE — Telephone Encounter (Signed)
Faxed hardcopy to [facility] [place] and called the patient left detailed msg. Of MD instructions and rx sent in this time only.

## 2013-06-26 ENCOUNTER — Other Ambulatory Visit: Payer: Self-pay

## 2013-07-15 ENCOUNTER — Encounter: Payer: Self-pay | Admitting: Internal Medicine

## 2013-07-15 ENCOUNTER — Ambulatory Visit (INDEPENDENT_AMBULATORY_CARE_PROVIDER_SITE_OTHER): Payer: 59 | Admitting: Internal Medicine

## 2013-07-15 VITALS — BP 120/76 | HR 89 | Temp 97.5°F | Ht 71.0 in | Wt 204.0 lb

## 2013-07-15 DIAGNOSIS — T25211A Burn of second degree of right ankle, initial encounter: Secondary | ICD-10-CM | POA: Insufficient documentation

## 2013-07-15 DIAGNOSIS — Z0000 Encounter for general adult medical examination without abnormal findings: Secondary | ICD-10-CM

## 2013-07-15 DIAGNOSIS — Z23 Encounter for immunization: Secondary | ICD-10-CM

## 2013-07-15 DIAGNOSIS — I10 Essential (primary) hypertension: Secondary | ICD-10-CM

## 2013-07-15 DIAGNOSIS — T25219A Burn of second degree of unspecified ankle, initial encounter: Secondary | ICD-10-CM

## 2013-07-15 MED ORDER — DOXYCYCLINE HYCLATE 100 MG PO TABS: 100.0000 mg | ORAL_TABLET | Freq: Two times a day (BID) | ORAL | Status: DC

## 2013-07-15 MED ORDER — SILVER SULFADIAZINE 1 % EX CREA: 1.0000 "application " | TOPICAL_CREAM | Freq: Two times a day (BID) | CUTANEOUS | Status: DC

## 2013-07-15 NOTE — Assessment & Plan Note (Signed)
stable overall by history and exam, recent data reviewed with pt, and pt to continue medical treatment as before,  to f/u any worsening symptoms or concerns BP Readings from Last 3 Encounters:  07/15/13 120/76  10/14/12 134/64  07/11/11 142/70
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
## 2013-07-15 NOTE — Progress Notes (Signed)
Pre-visit discussion using our clinic review tool. No additional management support is needed unless otherwise documented below in the visit note.  

## 2013-07-15 NOTE — Patient Instructions (Addendum)
Please take all new medication as prescribed - the silvadene cream Please continue all other medications as before You are also sent to the pharmacy a Pill antibiotic (doxycycline), but Only to use if you have worsening red/tender/swelling  Please return in 3 months, or sooner if needed, with Lab testing done 3-5 days before

## 2013-07-15 NOTE — Addendum Note (Signed)
Addended by: Scharlene Gloss B on: 07/15/2013 10:51 AM   Modules accepted: Orders

## 2013-07-15 NOTE — Assessment & Plan Note (Signed)
2 small areas medial right ankle with good appearance with neosporin in the past wk, pt educated, reassured, tx with silvadene cr and for doxy course over the long holiday weekend only if s/s infection occur - o/w silvadene only till healed

## 2013-08-13 ENCOUNTER — Other Ambulatory Visit: Payer: Self-pay

## 2013-08-13 MED ORDER — TRAMADOL HCL 50 MG PO TABS: ORAL_TABLET | ORAL | Status: DC

## 2013-08-13 NOTE — Telephone Encounter (Signed)
Done hardcopy to [facility]  

## 2013-08-13 NOTE — Telephone Encounter (Signed)
Faxed hardcopy to [facility] [place]

## 2013-10-15 ENCOUNTER — Encounter: Payer: 59 | Admitting: Internal Medicine

## 2013-11-11 ENCOUNTER — Encounter: Payer: Self-pay | Admitting: Internal Medicine

## 2013-11-11 ENCOUNTER — Ambulatory Visit (INDEPENDENT_AMBULATORY_CARE_PROVIDER_SITE_OTHER): Payer: 59 | Admitting: Internal Medicine

## 2013-11-11 ENCOUNTER — Other Ambulatory Visit (INDEPENDENT_AMBULATORY_CARE_PROVIDER_SITE_OTHER): Payer: 59

## 2013-11-11 VITALS — BP 120/68 | HR 82 | Temp 98.5°F | Ht 71.5 in | Wt 203.1 lb

## 2013-11-11 DIAGNOSIS — G8929 Other chronic pain: Secondary | ICD-10-CM

## 2013-11-11 DIAGNOSIS — Z0000 Encounter for general adult medical examination without abnormal findings: Secondary | ICD-10-CM

## 2013-11-11 DIAGNOSIS — I10 Essential (primary) hypertension: Secondary | ICD-10-CM

## 2013-11-11 DIAGNOSIS — M25559 Pain in unspecified hip: Secondary | ICD-10-CM

## 2013-11-11 LAB — BASIC METABOLIC PANEL
BUN: 13 mg/dL (ref 6–23)
CHLORIDE: 105 meq/L (ref 96–112)
CO2: 32 mEq/L (ref 19–32)
CREATININE: 0.9 mg/dL (ref 0.4–1.5)
Calcium: 9.6 mg/dL (ref 8.4–10.5)
GFR: 107.3 mL/min (ref >60.00)
Glucose, Bld: 85 mg/dL (ref 70–99)
Potassium: 3.9 mEq/L (ref 3.5–5.1)
Sodium: 141 mEq/L (ref 135–145)

## 2013-11-11 LAB — LIPID PANEL
CHOLESTEROL: 189 mg/dL (ref 0–200)
HDL: 57.9 mg/dL (ref >39.00)
LDL CALC: 117 mg/dL — AB (ref 0–99)
Total CHOL/HDL Ratio: 3
Triglycerides: 72 mg/dL (ref 0.0–149.0)
VLDL: 14.4 mg/dL (ref 0.0–40.0)

## 2013-11-11 LAB — URINALYSIS, ROUTINE W REFLEX MICROSCOPIC
Bilirubin Urine: NEGATIVE
Hgb urine dipstick: NEGATIVE
Leukocytes, UA: NEGATIVE
NITRITE: NEGATIVE
PH: 5.5 (ref 5.0–8.0)
RBC / HPF: NONE SEEN (ref 0–?)
Specific Gravity, Urine: >=1.03 — AB (ref 1.000–1.030)
Total Protein, Urine: NEGATIVE
Urine Glucose: NEGATIVE
Urobilinogen, UA: 0.2 (ref 0.0–1.0)

## 2013-11-11 LAB — CBC WITH DIFFERENTIAL/PLATELET
BASOS ABS: 0 10*3/uL (ref 0.0–0.1)
Basophils Relative: 0.5 % (ref 0.0–3.0)
EOS ABS: 0.1 10*3/uL (ref 0.0–0.7)
Eosinophils Relative: 2.6 % (ref 0.0–5.0)
HCT: 44.9 % (ref 39.0–52.0)
Hemoglobin: 14.8 g/dL (ref 13.0–17.0)
LYMPHS PCT: 46.4 % — AB (ref 12.0–46.0)
Lymphs Abs: 2.1 10*3/uL (ref 0.7–4.0)
MCHC: 33 g/dL (ref 30.0–36.0)
MCV: 86.6 fl (ref 78.0–100.0)
MONOS PCT: 9.2 % (ref 3.0–12.0)
Monocytes Absolute: 0.4 10*3/uL (ref 0.1–1.0)
Neutro Abs: 1.9 10*3/uL (ref 1.4–7.7)
Neutrophils Relative %: 41.3 % — ABNORMAL LOW (ref 43.0–77.0)
Platelets: 217 10*3/uL (ref 150.0–400.0)
RBC: 5.18 Mil/uL (ref 4.22–5.81)
RDW: 13.5 % (ref 11.5–14.6)
WBC: 4.5 10*3/uL (ref 4.5–10.5)

## 2013-11-11 LAB — HEPATIC FUNCTION PANEL
ALT: 24 U/L (ref 0–53)
AST: 28 U/L (ref 0–37)
Albumin: 4.1 g/dL (ref 3.5–5.2)
Alkaline Phosphatase: 77 U/L (ref 39–117)
BILIRUBIN DIRECT: 0.1 mg/dL (ref 0.0–0.3)
Total Bilirubin: 0.6 mg/dL (ref 0.3–1.2)
Total Protein: 6.7 g/dL (ref 6.0–8.3)

## 2013-11-11 LAB — PSA: PSA: 2.26 ng/mL (ref 0.10–4.00)

## 2013-11-11 LAB — TSH: TSH: 0.9 u[IU]/mL (ref 0.35–5.50)

## 2013-11-11 MED ORDER — AMLODIPINE BESYLATE 10 MG PO TABS: 10.0000 mg | ORAL_TABLET | Freq: Every day | ORAL | Status: DC

## 2013-11-11 MED ORDER — TADALAFIL 20 MG PO TABS: 20.0000 mg | ORAL_TABLET | Freq: Every day | ORAL | Status: DC | PRN

## 2013-11-11 NOTE — Patient Instructions (Addendum)
Please continue all other medications as before, and refills have been done if requested - the cialis Please have the pharmacy call with any other refills you may need.  Please continue your efforts at being more active, low cholesterol diet, and weight control. You are otherwise up to date with prevention measures today.  Please keep your appointments with your specialists as you have planned  Please go to the LAB in the Basement (turn left off the elevator) for the tests to be done today You will be contacted by phone if any changes need to be made immediately.  Otherwise, you will receive a letter about your results with an explanation, but please check with MyChart first.  Please return in 6 months, or sooner if needed, with Lab testing done 3-5 days before

## 2013-11-11 NOTE — Assessment & Plan Note (Signed)
Cont tramadol prn, [facility]

## 2013-11-11 NOTE — Assessment & Plan Note (Addendum)

## 2013-11-11 NOTE — Progress Notes (Signed)
Subjective:    Patient ID: Kent Thomas, male    DOB: March 04, 1956, 58 y.o.   MRN: [id number]  HPI  Here for wellness and f/u;  Overall doing ok;  Pt denies CP, worsening SOB, DOE, wheezing, orthopnea, PND, worsening LE edema, palpitations, dizziness or syncope.  Pt denies neurological change such as new headache, facial or extremity weakness.  Pt denies polydipsia, polyuria, or low sugar symptoms. Pt states overall good compliance with treatment and medications, good tolerability, and has been trying to follow lower cholesterol diet.  Pt denies worsening depressive symptoms, suicidal ideation or panic. No fever, night sweats, wt loss, loss of appetite, or other constitutional symptoms.  Pt states good ability with ADL's, has low fall risk, home safety reviewed and adequate, no other significant changes in hearing or vision, and only occasionally active with exercise, due to chronic hip pain, still works up to 12 hr days, requires the tramadol, may go to [place] for the hip replacement, he is still considering. Past Medical History  Diagnosis Date  . HYPERTENSION 05/28/2008  . HYPERLIPIDEMIA 04/12/2007  . ASTHMA 04/12/2007  . GLAUCOMA NOS 04/12/2007  . ERECTILE DYSFUNCTION 04/12/2007   No past surgical history on file.  reports that he has quit smoking. He does not have any smokeless tobacco history on file. His alcohol and drug histories are not on file. family history is not on file. Allergies  Allergen Reactions  . Penicillins    Current Outpatient Prescriptions on File Prior to Visit  Medication Sig Dispense Refill  . aspirin 81 MG tablet Take 81 mg by mouth daily.        . traMADol (ULTRAM) 50 MG tablet TAKE ONE TABLET BY MOUTH EVERY 6 HOURS AS NEEDED FOR PAIN  120 tablet  1   No current facility-administered medications on file prior to visit.   Review of Systems Constitutional: Negative for diaphoresis, activity change, appetite change or unexpected weight change.  HENT: Negative for  hearing loss, ear pain, facial swelling, mouth sores and neck stiffness.   Eyes: Negative for pain, redness and visual disturbance.  Respiratory: Negative for shortness of breath and wheezing.   Cardiovascular: Negative for chest pain and palpitations.  Gastrointestinal: Negative for diarrhea, blood in stool, abdominal distention or other pain Genitourinary: Negative for hematuria, flank pain or change in urine volume.  Musculoskeletal: Negative for myalgias and joint swelling.  Skin: Negative for color change and wound.  Neurological: Negative for syncope and numbness. other than noted Hematological: Negative for adenopathy.  Psychiatric/Behavioral: Negative for hallucinations, self-injury, decreased concentration and agitation.      Objective:   Physical Exam BP 120/68  Pulse 82  Temp(Src) 98.5 F (36.9 C) (Oral)  Ht 5' 11.5" (1.816 m)  Wt 203 lb 2 oz (92.137 kg)  BMI 27.94 kg/m2  SpO2 97% VS noted,  Constitutional: Pt is oriented to person, place, and time. Appears well-developed and well-nourished.  Head: Normocephalic and atraumatic.  Right Ear: External ear normal.  Left Ear: External ear normal.  Nose: Nose normal.  Mouth/Throat: Oropharynx is clear and moist.  Eyes: Conjunctivae and EOM are normal. Pupils are equal, round, and reactive to light.  Neck: Normal range of motion. Neck supple. No JVD present. No tracheal deviation present.  Cardiovascular: Normal rate, regular rhythm, normal heart sounds and intact distal pulses.   Pulmonary/Chest: Effort normal and breath sounds normal.  Abdominal: Soft. Bowel sounds are normal. There is no tenderness. No HSM  Musculoskeletal: Normal range of motion.  Exhibits no edema.  Lymphadenopathy:  Has no cervical adenopathy.  Neurological: Pt is alert and oriented to person, place, and time. Pt has normal reflexes. No cranial nerve deficit.  Skin: Skin is warm and dry. No rash noted.  Psychiatric:  Has  normal mood and affect.  Behavior is normal.     Assessment & Plan:

## 2013-11-11 NOTE — Assessment & Plan Note (Signed)
stable overall by history and exam, recent data reviewed with pt, and pt to continue medical treatment as before,  to f/u any worsening symptoms or concerns le BP Readings from Last 3 Encounters:  11/11/13 120/68  07/15/13 120/76  10/14/12 134/64

## 2013-12-22 ENCOUNTER — Ambulatory Visit (AMBULATORY_SURGERY_CENTER): Payer: Self-pay | Admitting: *Deleted

## 2013-12-22 VITALS — Ht 71.5 in | Wt 206.0 lb

## 2013-12-22 DIAGNOSIS — Z86010 Personal history of colonic polyps: Secondary | ICD-10-CM

## 2013-12-22 MED ORDER — MOVIPREP 100 G PO SOLR: ORAL | Status: DC

## 2013-12-22 NOTE — Progress Notes (Signed)
Patient denies any allergies to eggs or soy. Patient denies any problems with anesthesia/sedation. Pt denies any oxygen use at home. Does not take diet/weight loss medications. EMMI education assisgned to patient on colonoscopy.

## 2013-12-29 ENCOUNTER — Encounter: Payer: Self-pay | Admitting: Internal Medicine

## 2014-01-05 ENCOUNTER — Encounter: Payer: Self-pay | Admitting: Internal Medicine

## 2014-01-05 ENCOUNTER — Ambulatory Visit (AMBULATORY_SURGERY_CENTER): Payer: 59 | Admitting: Internal Medicine

## 2014-01-05 VITALS — BP 103/58 | HR 70 | Temp 97.5°F | Resp 10 | Ht 71.5 in | Wt 206.0 lb

## 2014-01-05 DIAGNOSIS — D126 Benign neoplasm of colon, unspecified: Secondary | ICD-10-CM

## 2014-01-05 DIAGNOSIS — Z86010 Personal history of colonic polyps: Secondary | ICD-10-CM

## 2014-01-05 MED ORDER — SODIUM CHLORIDE 0.9 % IV SOLN: 500.0000 mL | INTRAVENOUS | Status: DC

## 2014-01-05 NOTE — Patient Instructions (Signed)
Discharge instructions given with verbal understanding. Handouts on polyps and diverticulosis. Resume previous medications. YOU HAD AN ENDOSCOPIC PROCEDURE TODAY AT THE [facility]: Refer to the procedure report that was given to you for any specific questions about what was found during the examination.  If the procedure report does not answer your questions, please call your gastroenterologist to clarify.  If you requested that your care partner not be given the details of your procedure findings, then the procedure report has been included in a sealed envelope for you to review at your convenience later.  YOU SHOULD EXPECT: Some feelings of bloating in the abdomen. Passage of more gas than usual.  Walking can help get rid of the air that was put into your GI tract during the procedure and reduce the bloating. If you had a lower endoscopy (such as a colonoscopy or flexible sigmoidoscopy) you may notice spotting of blood in your stool or on the toilet paper. If you underwent a bowel prep for your procedure, then you may not have a normal bowel movement for a few days.  DIET: Your first meal following the procedure should be a light meal and then it is ok to progress to your normal diet.  A half-sandwich or bowl of soup is an example of a good first meal.  Heavy or fried foods are harder to digest and may make you feel nauseous or bloated.  Likewise meals heavy in dairy and vegetables can cause extra gas to form and this can also increase the bloating.  Drink plenty of fluids but you should avoid alcoholic beverages for 24 hours.  ACTIVITY: Your care partner should take you home directly after the procedure.  You should plan to take it easy, moving slowly for the rest of the day.  You can resume normal activity the day after the procedure however you should NOT DRIVE or use heavy machinery for 24 hours (because of the sedation medicines used during the test).    SYMPTOMS TO REPORT  IMMEDIATELY: A gastroenterologist can be reached at any hour.  During normal business hours, 8:30 AM to 5:00 PM Monday through Friday, call [phone].  After hours and on weekends, please call the GI answering service at [phone] who will take a message and have the physician on call contact you.   Following lower endoscopy (colonoscopy or flexible sigmoidoscopy):  Excessive amounts of blood in the stool  Significant tenderness or worsening of abdominal pains  Swelling of the abdomen that is new, acute  Fever of 100F or higher  FOLLOW UP: If any biopsies were taken you will be contacted by phone or by letter within the next 1-3 weeks.  Call your gastroenterologist if you have not heard about the biopsies in 3 weeks.  Our staff will call the home number listed on your records the next business day following your procedure to check on you and address any questions or concerns that you may have at that time regarding the information given to you following your procedure. This is a courtesy call and so if there is no answer at the home number and we have not heard from you through the emergency physician on call, we will assume that you have returned to your regular daily activities without incident.  SIGNATURES/CONFIDENTIALITY: You and/or your care partner have signed paperwork which will be entered into your electronic medical record.  These signatures attest to the fact that that the information above on your After Visit Summary   has been reviewed and is understood.  Full responsibility of the confidentiality of this discharge information lies with you and/or your care-partner. 

## 2014-01-05 NOTE — Progress Notes (Signed)
Report to pacu rn, vss, bbs=clear 

## 2014-01-05 NOTE — Op Note (Signed)
[facility]  [address]. [place], [address]   COLONOSCOPY PROCEDURE REPORT  PATIENT: Kent Thomas, Kent Thomas.  MR#: [id number] BIRTHDATE: 03-09-1956 , 58  yrs. old GENDER: Male ENDOSCOPIST: Eustace Quail, MD REFERRED GH:[id number] Program Recall PROCEDURE DATE:  01/05/2014 PROCEDURE:   Colonoscopy with snare polypectomy x 3 First Screening Colonoscopy - Avg.  risk and is 50 yrs.  old or older - No.  Prior Negative Screening - Now for repeat screening. N/A  History of Adenoma - Now for follow-up colonoscopy & has been > or = to 3 yrs.  Yes hx of adenoma.  Has been 3 or more years since last colonoscopy.  Polyps Removed Today? Yes. ASA CLASS:   Class II INDICATIONS:Patient's personal history of adenomatous colon polyps. Index October 2007 with small tubular adenoma. MEDICATIONS: MAC sedation, administered by CRNA and propofol (Diprivan) 280mg  IV  DESCRIPTION OF PROCEDURE:   After the risks benefits and alternatives of the procedure were thoroughly explained, informed consent was obtained.  A digital rectal exam revealed no abnormalities of the rectum.   The [id number]  endoscope was introduced through the anus and advanced to the cecum, which was identified by both the appendix and ileocecal valve. No adverse events experienced.   The quality of the prep was excellent, using MoviPrep  The instrument was then slowly withdrawn as the colon was fully examined.  COLON FINDINGS: Three diminutive polyps were found in the transverse colon.  A polypectomy was performed with a cold snare.  The resection was complete and the polyp tissue was completely retrieved.   Mild diverticulosis was noted The finding was in the left colon.   The colon mucosa was otherwise normal.  Retroflexed views revealed no abnormalities. The time to cecum=3 minutes 51 seconds.  Withdrawal time=11 minutes 18 seconds.  The scope was withdrawn and the procedure completed. COMPLICATIONS:  There were no complications.  ENDOSCOPIC IMPRESSION: 1.   Three diminutive polyps were found in the transverse colon; polypectomy was performed with a cold snare 2.   Mild diverticulosis was noted in the left colon 3.   The colon mucosa was otherwise normal  RECOMMENDATIONS: 1. Follow up colonoscopy in 5 years   eSigned:  Eustace Quail, MD 01/05/2014 10:43 AM   cc: Biagio Borg, MD and The Patient

## 2014-01-05 NOTE — Progress Notes (Signed)
Called to room to assist during endoscopic procedure.  Patient ID and intended procedure confirmed with present staff. Received instructions for my participation in the procedure from the performing physician.  

## 2014-01-06 ENCOUNTER — Telehealth: Payer: Self-pay

## 2014-01-06 NOTE — Telephone Encounter (Signed)
Left a message at # [phone] for the pt to call back if any questions or concerns. maw

## 2014-01-09 ENCOUNTER — Encounter: Payer: Self-pay | Admitting: Internal Medicine

## 2014-01-21 ENCOUNTER — Other Ambulatory Visit: Payer: Self-pay | Admitting: Internal Medicine

## 2014-01-21 NOTE — Telephone Encounter (Signed)
Done hardcopy to [facility]  

## 2014-01-21 NOTE — Telephone Encounter (Signed)
Faxed hardcopy to [facility]

## 2014-05-01 ENCOUNTER — Other Ambulatory Visit: Payer: Self-pay | Admitting: Internal Medicine

## 2014-05-01 NOTE — Telephone Encounter (Signed)
Done hardcopy to [facility]  

## 2014-05-04 NOTE — Telephone Encounter (Signed)
Faxed script back to [facility].../lmb 

## 2014-09-02 ENCOUNTER — Other Ambulatory Visit: Payer: Self-pay | Admitting: Internal Medicine

## 2014-09-02 NOTE — Telephone Encounter (Signed)
rx faxed to [facility]  

## 2014-09-02 NOTE — Telephone Encounter (Signed)
Done hardcopy to [facility]  

## 2015-01-21 ENCOUNTER — Other Ambulatory Visit: Payer: Self-pay | Admitting: Internal Medicine

## 2015-01-21 NOTE — Telephone Encounter (Signed)
MD out of office pls advise on refill.../lmb 

## 2015-01-24 ENCOUNTER — Other Ambulatory Visit: Payer: Self-pay | Admitting: Internal Medicine

## 2015-01-26 NOTE — Telephone Encounter (Signed)
Done hardcopy to [facility]  

## 2015-01-26 NOTE — Telephone Encounter (Signed)
Rx faxed to pharmacy  

## 2015-03-10 ENCOUNTER — Other Ambulatory Visit: Payer: Self-pay | Admitting: Internal Medicine

## 2015-04-01 ENCOUNTER — Telehealth: Payer: Self-pay | Admitting: Internal Medicine

## 2015-04-01 NOTE — Telephone Encounter (Signed)
Pt called regarding his medication refills. They were denied because he needs to come in for an appointment. He wanted to talk with you about that because he is out of insurance for another 3 mos and was hoping to get more medication till then Please advise.

## 2015-04-02 MED ORDER — AMLODIPINE BESYLATE 10 MG PO TABS: 10.0000 mg | ORAL_TABLET | Freq: Every day | ORAL | Status: DC

## 2015-04-02 NOTE — Telephone Encounter (Signed)
Very sorry, cannot refill tramadol as this is a controlled substance, and pt not seen since mar 2015, and [place] medical board has been more aggressive in their monitoring of what they define as inappropriate prescribing.

## 2015-04-02 NOTE — Telephone Encounter (Signed)
3 mth Rx for BP medication sent to pharmacy. Pt advised of same

## 2015-04-02 NOTE — Telephone Encounter (Signed)
Please advise, pt's last OV was 03.2015 but pt will not have insurance for another 3 months and is requesting Rx for Tramadol until he can make ROV

## 2015-04-02 NOTE — Telephone Encounter (Signed)
Please advise, last OV was03.2015

## 2015-04-02 NOTE — Telephone Encounter (Signed)
Pt advised via VM 

## 2015-04-02 NOTE — Telephone Encounter (Signed)
Patient called back in.  I advised him about his bp meds.  He is also wanting tramadol to be refilled.

## 2015-06-22 ENCOUNTER — Ambulatory Visit (INDEPENDENT_AMBULATORY_CARE_PROVIDER_SITE_OTHER): Payer: PRIVATE HEALTH INSURANCE | Admitting: Internal Medicine

## 2015-06-22 ENCOUNTER — Encounter: Payer: Self-pay | Admitting: Internal Medicine

## 2015-06-22 VITALS — BP 142/84 | HR 76 | Temp 98.6°F | Ht 70.0 in | Wt 228.0 lb

## 2015-06-22 DIAGNOSIS — M25559 Pain in unspecified hip: Secondary | ICD-10-CM

## 2015-06-22 DIAGNOSIS — G8929 Other chronic pain: Secondary | ICD-10-CM

## 2015-06-22 DIAGNOSIS — Z23 Encounter for immunization: Secondary | ICD-10-CM | POA: Diagnosis not present

## 2015-06-22 DIAGNOSIS — Z0000 Encounter for general adult medical examination without abnormal findings: Secondary | ICD-10-CM | POA: Diagnosis not present

## 2015-06-22 MED ORDER — AMLODIPINE BESYLATE 10 MG PO TABS: 10.0000 mg | ORAL_TABLET | Freq: Every day | ORAL | Status: DC

## 2015-06-22 MED ORDER — TRAMADOL HCL 50 MG PO TABS: 50.0000 mg | ORAL_TABLET | Freq: Four times a day (QID) | ORAL | Status: DC | PRN

## 2015-06-22 MED ORDER — TADALAFIL 20 MG PO TABS: 20.0000 mg | ORAL_TABLET | Freq: Every day | ORAL | Status: DC | PRN

## 2015-06-22 NOTE — Patient Instructions (Addendum)
You had the flu shot today  Please continue all other medications as before, and refills have been done if requested - the tramadol  Please have the pharmacy call with a refill request for the medication you mentioned  Please have the pharmacy call with any other refills you may need.  Please continue your efforts at being more active, low cholesterol diet, and weight control.  You are otherwise up to date with prevention measures today.  You will be contacted regarding the referral for: Dr Tamala Julian - sports medicine  Please keep your appointments with your specialists as you may have planned  Please go to the LAB in the Basement (turn left off the elevator) for the tests to be done tomorrow or when you can (like next Tuesday after you vote)  You will be contacted by phone if any changes need to be made immediately.  Otherwise, you will receive a letter about your results with an explanation, but please check with MyChart first.  Please remember to sign up for MyChart if you have not done so, as this will be important to you in the future with finding out test results, communicating by private email, and scheduling acute appointments online when needed.  Please return in 1 year for your yearly visit, or sooner if needed, with Lab testing done 3-5 days before

## 2015-06-22 NOTE — Progress Notes (Signed)
Pre visit review using our clinic review tool, if applicable. No additional management support is needed unless otherwise documented below in the visit note. 

## 2015-06-22 NOTE — Progress Notes (Signed)
Subjective:    Patient ID: Kent Thomas, male    DOB: Jan 20, 1956, 59 y.o.   MRN: [id number]  HPI  Here for wellness and f/u;  Overall doing ok;  Pt denies Chest pain, worsening SOB, DOE, wheezing, orthopnea, PND, worsening LE edema, palpitations, dizziness or syncope.  Pt denies neurological change such as new headache, facial or extremity weakness.  Pt denies polydipsia, polyuria, or low sugar symptoms. Pt states overall good compliance with treatment and medications, good tolerability, and has been trying to follow appropriate diet.  Pt denies worsening depressive symptoms, suicidal ideation or panic. No fever, night sweats, wt loss, loss of appetite, or other constitutional symptoms.  Pt states good ability with ADL's, has low fall risk, home safety reviewed and adequate, no other significant changes in hearing or vision, and only occasionally active with exercise.  Does have persistent chronic hip pain, has not been eval recently.  Asks for med refills.Due for flu shot Past Medical History  Diagnosis Date  . HYPERTENSION 05/28/2008  . HYPERLIPIDEMIA 04/12/2007  . ASTHMA 04/12/2007  . GLAUCOMA NOS 04/12/2007  . ERECTILE DYSFUNCTION 04/12/2007   Past Surgical History  Procedure Laterality Date  . Cataract extraction    . Hammer toe surgery  09/2012    reports that he has quit smoking. He has never used smokeless tobacco. He reports that he does not drink alcohol or use illicit drugs. family history is negative for Colon cancer. Allergies  Allergen Reactions  . Penicillins Other (See Comments)    "always told that"   Current Outpatient Prescriptions on File Prior to Visit  Medication Sig Dispense Refill  . aspirin 81 MG tablet Take 81 mg by mouth daily.       No current facility-administered medications on file prior to visit.    Review of Systems Constitutional: Negative for increased diaphoresis, other activity, appetite or siginficant weight change other than noted HENT: Negative  for worsening hearing loss, ear pain, facial swelling, mouth sores and neck stiffness.   Eyes: Negative for other worsening pain, redness or visual disturbance.  Respiratory: Negative for shortness of breath and wheezing  Cardiovascular: Negative for chest pain and palpitations.  Gastrointestinal: Negative for diarrhea, blood in stool, abdominal distention or other pain Genitourinary: Negative for hematuria, flank pain or change in urine volume.  Musculoskeletal: Negative for myalgias or other joint complaints.  Skin: Negative for color change and wound or drainage.  Neurological: Negative for syncope and numbness. other than noted Hematological: Negative for adenopathy. or other swelling Psychiatric/Behavioral: Negative for hallucinations, SI, self-injury, decreased concentration or other worsening agitation.      Objective:   Physical Exam BP 142/84 mmHg  Pulse 76  Temp(Src) 98.6 F (37 C) (Oral)  Ht 5\' 10"  (1.778 m)  Wt 228 lb (103.42 kg)  BMI 32.71 kg/m2  SpO2 97% VS noted,  Constitutional: Pt is oriented to person, place, and time. Appears well-developed and well-nourished, in no significant distress Head: Normocephalic and atraumatic.  Right Ear: External ear normal.  Left Ear: External ear normal.  Nose: Nose normal.  Mouth/Throat: Oropharynx is clear and moist.  Eyes: Conjunctivae and EOM are normal. Pupils are equal, round, and reactive to light.  Neck: Normal range of motion. Neck supple. No JVD present. No tracheal deviation present or significant neck LA or mass Cardiovascular: Normal rate, regular rhythm, normal heart sounds and intact distal pulses.   Pulmonary/Chest: Effort normal and breath sounds without rales or wheezing  Abdominal: Soft. Bowel sounds  are normal. NT. No HSM  Musculoskeletal: Normal range of motion. Exhibits no edema.  Lymphadenopathy:  Has no cervical adenopathy.  Neurological: Pt is alert and oriented to person, place, and time. Pt has normal  reflexes. No cranial nerve deficit. Motor grossly intact Spine nontender Skin: Skin is warm and dry. No rash noted.  Psychiatric:  Has normal mood and affect. Behavior is normal.     Assessment & Plan:

## 2015-06-26 NOTE — Assessment & Plan Note (Signed)

## 2015-06-26 NOTE — Assessment & Plan Note (Signed)
For sport med referral 

## 2015-06-29 ENCOUNTER — Other Ambulatory Visit (INDEPENDENT_AMBULATORY_CARE_PROVIDER_SITE_OTHER): Payer: PRIVATE HEALTH INSURANCE

## 2015-06-29 DIAGNOSIS — Z0000 Encounter for general adult medical examination without abnormal findings: Secondary | ICD-10-CM | POA: Diagnosis not present

## 2015-06-29 LAB — HEPATIC FUNCTION PANEL
ALT: 29 U/L (ref 0–53)
AST: 25 U/L (ref 0–37)
Albumin: 4.2 g/dL (ref 3.5–5.2)
Alkaline Phosphatase: 85 U/L (ref 39–117)
Bilirubin, Direct: 0.1 mg/dL (ref 0.0–0.3)
Total Bilirubin: 0.4 mg/dL (ref 0.2–1.2)
Total Protein: 6.8 g/dL (ref 6.0–8.3)

## 2015-06-29 LAB — URINALYSIS, ROUTINE W REFLEX MICROSCOPIC
BILIRUBIN URINE: NEGATIVE
Hgb urine dipstick: NEGATIVE
KETONES UR: NEGATIVE
Leukocytes, UA: NEGATIVE
NITRITE: NEGATIVE
RBC / HPF: NONE SEEN (ref 0–?)
Specific Gravity, Urine: 1.015 (ref 1.000–1.030)
Total Protein, Urine: NEGATIVE
UROBILINOGEN UA: 0.2 (ref 0.0–1.0)
Urine Glucose: NEGATIVE
WBC UA: NONE SEEN (ref 0–?)
pH: 7 (ref 5.0–8.0)

## 2015-06-29 LAB — CBC WITH DIFFERENTIAL/PLATELET
BASOS PCT: 0.5 % (ref 0.0–3.0)
Basophils Absolute: 0 10*3/uL (ref 0.0–0.1)
EOS ABS: 0.1 10*3/uL (ref 0.0–0.7)
EOS PCT: 3.5 % (ref 0.0–5.0)
HEMATOCRIT: 47.4 % (ref 39.0–52.0)
HEMOGLOBIN: 15.8 g/dL (ref 13.0–17.0)
LYMPHS PCT: 35.3 % (ref 12.0–46.0)
Lymphs Abs: 1.4 10*3/uL (ref 0.7–4.0)
MCHC: 33.3 g/dL (ref 30.0–36.0)
MCV: 84.8 fl (ref 78.0–100.0)
MONO ABS: 0.4 10*3/uL (ref 0.1–1.0)
Monocytes Relative: 10.7 % (ref 3.0–12.0)
Neutro Abs: 2 10*3/uL (ref 1.4–7.7)
Neutrophils Relative %: 50 % (ref 43.0–77.0)
Platelets: 200 10*3/uL (ref 150.0–400.0)
RBC: 5.59 Mil/uL (ref 4.22–5.81)
RDW: 13.8 % (ref 11.5–15.5)
WBC: 3.9 10*3/uL — AB (ref 4.0–10.5)

## 2015-06-29 LAB — LIPID PANEL
Cholesterol: 188 mg/dL (ref 0–200)
HDL: 47.4 mg/dL (ref >39.00)
LDL Cholesterol: 121 mg/dL — ABNORMAL HIGH (ref 0–99)
NonHDL: 140.92
Total CHOL/HDL Ratio: 4
Triglycerides: 101 mg/dL (ref 0.0–149.0)
VLDL: 20.2 mg/dL (ref 0.0–40.0)

## 2015-06-29 LAB — PSA: PSA: 2.36 ng/mL (ref 0.10–4.00)

## 2015-06-29 LAB — BASIC METABOLIC PANEL
BUN: 16 mg/dL (ref 6–23)
CO2: 30 mEq/L (ref 19–32)
Calcium: 9.5 mg/dL (ref 8.4–10.5)
Chloride: 103 mEq/L (ref 96–112)
Creatinine, Ser: 1.25 mg/dL (ref 0.40–1.50)
GFR: 75.85 mL/min (ref >60.00)
Glucose, Bld: 100 mg/dL — ABNORMAL HIGH (ref 70–99)
Potassium: 4.4 mEq/L (ref 3.5–5.1)
Sodium: 138 mEq/L (ref 135–145)

## 2015-06-29 LAB — TSH: TSH: 0.68 u[IU]/mL (ref 0.35–4.50)

## 2015-08-03 ENCOUNTER — Ambulatory Visit (INDEPENDENT_AMBULATORY_CARE_PROVIDER_SITE_OTHER): Payer: PRIVATE HEALTH INSURANCE | Admitting: Family Medicine

## 2015-08-03 ENCOUNTER — Other Ambulatory Visit (INDEPENDENT_AMBULATORY_CARE_PROVIDER_SITE_OTHER): Payer: PRIVATE HEALTH INSURANCE

## 2015-08-03 ENCOUNTER — Ambulatory Visit (INDEPENDENT_AMBULATORY_CARE_PROVIDER_SITE_OTHER)
Admission: RE | Admit: 2015-08-03 | Discharge: 2015-08-03 | Disposition: A | Discharge status: Home / Self Care | Payer: PRIVATE HEALTH INSURANCE | Source: Ambulatory Visit | Attending: Family Medicine | Admitting: Family Medicine

## 2015-08-03 ENCOUNTER — Encounter: Payer: Self-pay | Admitting: Family Medicine

## 2015-08-03 VITALS — BP 122/68 | HR 84 | Ht 70.0 in | Wt 231.0 lb

## 2015-08-03 DIAGNOSIS — M1612 Unilateral primary osteoarthritis, left hip: Secondary | ICD-10-CM | POA: Insufficient documentation

## 2015-08-03 DIAGNOSIS — M1990 Unspecified osteoarthritis, unspecified site: Secondary | ICD-10-CM

## 2015-08-03 DIAGNOSIS — M7062 Trochanteric bursitis, left hip: Secondary | ICD-10-CM

## 2015-08-03 DIAGNOSIS — M25552 Pain in left hip: Secondary | ICD-10-CM | POA: Diagnosis not present

## 2015-08-03 NOTE — Assessment & Plan Note (Signed)
Patient is going to try topical anti-inflammatory's. Did respond well to the injection. 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 Patient is already walking with an external rotated gait. I do believe the patient is going to need surgical intervention sooner than later. Patient wants to hold out at this time. Patient will try the conservative therapy and see me again in 3-4 weeks for further evaluation. X-rays pending.

## 2015-08-03 NOTE — Progress Notes (Signed)
[facility] [address] [place], [place] [address] Phone: [phone] Subjective:    I'm seeing this patient by the request  of:  Cathlean Cower, MD   CC: Left hip pain  [id number] Kent Thomas is a 59 y.o. male coming in with complaint of left hip pain.  Patient is had this pain for quite some time. Did have a football injury when he is a middle school. Patient did have a dislocation of his hip. Patient had pinning thereafterwards. Patient states he had been doing very well. Started to have an exacerbation over-the-counter course last 3 years and severely worse over the course last 3 months. Patient states that the pain seems to be more on the lateral aspect of the hip. Patient discusses a dull, throbbing aching sensation. Denies any numbness or tingling down the leg. Very mild intermittent groin pain. Starting to stop him from certain activities and can be very painful at night. States still the pain is mostly on the lateral aspect of the hip. Rates the severity of pain a 7 out of 10 and does respond to anti-inflammatories over-the-counter.  Past Medical History  Diagnosis Date  . HYPERTENSION 05/28/2008  . HYPERLIPIDEMIA 04/12/2007  . ASTHMA 04/12/2007  . GLAUCOMA NOS 04/12/2007  . ERECTILE DYSFUNCTION 04/12/2007   Past Surgical History  Procedure Laterality Date  . Cataract extraction    . Hammer toe surgery  09/2012   Social History  Substance Use Topics  . Smoking status: Former Research scientist (life sciences)  . Smokeless tobacco: Never Used  . Alcohol Use: No   Allergies  Allergen Reactions  . Penicillins Other (See Comments)    "always told that"   Family History  Problem Relation Age of Onset  . Colon cancer Neg Hx         Past medical history, social, surgical and family history all reviewed in electronic medical record.   Review of Systems: No headache, visual changes, nausea, vomiting, diarrhea, constipation, dizziness, abdominal pain, skin rash,  fevers, chills, night sweats, weight loss, swollen lymph nodes, body aches, joint swelling, muscle aches, chest pain, shortness of breath, mood changes.   Objective Blood pressure 122/68, pulse 84, height 5\' 10"  (1.778 m), weight 231 lb (104.781 kg), SpO2 96 %.  General: No apparent distress alert and oriented x3 mood and affect normal, dressed appropriately.  HEENT: Pupils equal, extraocular movements intact  Respiratory: Patient's speak in full sentences and does not appear short of breath  Cardiovascular: No lower extremity edema, non tender, no erythema  Skin: Warm dry intact with no signs of infection or rash on extremities or on axial skeleton.  Abdomen: Soft nontender  Neuro: Cranial nerves II through XII are intact, neurovascularly intact in all extremities with 2+ DTRs and 2+ pulses.  Lymph: No lymphadenopathy of posterior or anterior cervical chain or axillae bilaterally.  Gait antalgic gait with external rotation of the hip..  MSK:  Non tender with full range of motion and good stability and symmetric strength and tone of shoulders, elbows, wrist,  knee and ankles bilaterally.  [id number] ROM IR: 5 Deg with severe pain, ER: 45 Deg, Flexion: 120 Deg, Extension: 100 Deg, Abduction: 45 Deg, Adduction: 45 Deg Strength IR: 4/5, ER: 5/5, Flexion: 5/5, Extension: 5/5, Abduction: 4/5, Adduction: 4/5 Pelvic alignment shows external rotation of the leg with mild shortening.. Standing hip rotation and gait without trendelenburg sign / unsteadiness. Greater trochanter with severe tenderness to palpation. No tenderness over piriformis  Severe pain  with both Corky Sox as well as internal rotation No SI joint tenderness and normal minimal SI movement.  MSK US performed of: Left hip This study was ordered, performed, and interpreted by Charlann Boxer D.O.  Hip: Trochanteric bursa with significant hypoechoic changes and swelling Acetabular labrum visualized and without tears, displacement, or effusion  in joint. Femoral neck appears unremarkable without increased power doppler signal along Cortex.  IMPRESSION:  Greater trochanter bursitis   Procedure: Real-time Ultrasound Guided Injection of left greater trochanteric bursitis secondary to patient's body habitus Device: GE Logiq E  Ultrasound guided injection is preferred based studies that show increased duration, increased effect, greater accuracy, decreased procedural pain, increased response rate, and decreased cost with ultrasound guided versus blind injection.  Verbal informed consent obtained.  Time-out conducted.  Noted no overlying erythema, induration, or other signs of local infection.  Skin prepped in a sterile fashion.  Local anesthesia: Topical Ethyl chloride.  With sterile technique and under real time ultrasound guidance:  Greater trochanteric area was visualized and patient's bursa was noted. A 22-gauge 3 inch needle was inserted and 4 cc of 0.5% Marcaine and 1 cc of Kenalog 40 mg/dL was injected. Pictures taken Completed without difficulty  Pain immediately resolved suggesting accurate placement of the medication.  Advised to call if fevers/chills, erythema, induration, drainage, or persistent bleeding.  Images permanently stored and available for review in the ultrasound unit.  Impression: Technically successful ultrasound guided injection.     Impression and Recommendations:     This case required medical decision making of moderate complexity.

## 2015-08-03 NOTE — Progress Notes (Signed)
Pre visit review using our clinic review tool, if applicable. No additional management support is needed unless otherwise documented below in the visit note. 

## 2015-08-03 NOTE — Patient Instructions (Signed)
Good ot see you Ice 20 minutes 2 times daily. Usually after activity and before bed. Exercises 3 times a week.  Take tylenol 650 mg three times a day is the best evidence based medicine we have for arthritis.  Glucosamine sulfate 1500mg  a day is a supplement that has been shown to help moderate to severe arthritis. Vitamin D 2000 IU daily Fish oil 2 grams daily.  Tumeric 500mg  twice daily.  It's important that you continue to stay active. Controlling your weight is important.  Water aerobics and cycling with low resistance are the best two types of exercise for arthritis. Come back and see me in 3-4 weeks.  Happy holidays!

## 2016-01-09 ENCOUNTER — Other Ambulatory Visit: Payer: Self-pay | Admitting: Internal Medicine

## 2016-01-11 NOTE — Telephone Encounter (Signed)
Done hardcopy to Corinne  

## 2016-01-11 NOTE — Telephone Encounter (Signed)
Medication sent to pharmacy  

## 2016-02-16 ENCOUNTER — Other Ambulatory Visit: Payer: Self-pay | Admitting: Internal Medicine

## 2016-05-29 ENCOUNTER — Other Ambulatory Visit: Payer: Self-pay | Admitting: Internal Medicine

## 2016-05-31 ENCOUNTER — Telehealth: Payer: Self-pay | Admitting: Internal Medicine

## 2016-05-31 DIAGNOSIS — Z23 Encounter for immunization: Secondary | ICD-10-CM

## 2016-05-31 MED ORDER — TADALAFIL 20 MG PO TABS: 20.0000 mg | ORAL_TABLET | Freq: Every day | ORAL | 5 refills | Status: AC | PRN

## 2016-05-31 MED ORDER — AMLODIPINE BESYLATE 10 MG PO TABS: ORAL_TABLET | ORAL | 0 refills | Status: AC

## 2016-05-31 NOTE — Telephone Encounter (Signed)
This has been done.

## 2016-05-31 NOTE — Telephone Encounter (Signed)
Patient sent my chart message requesting HEP C screening and refills on all medications to be sent to pharmacy.

## 2016-12-20 IMAGING — DX DG HIP (WITH OR WITHOUT PELVIS) 2-3V*L*
3 series · 3 of 3 positions shown · non-contrast
Comparison: None.

CLINICAL DATA: Chronic left hip pain.  History of dislocation

EXAM:
DG HIP (WITH OR WITHOUT PELVIS) 2-3V LEFT

[pelvis ap]
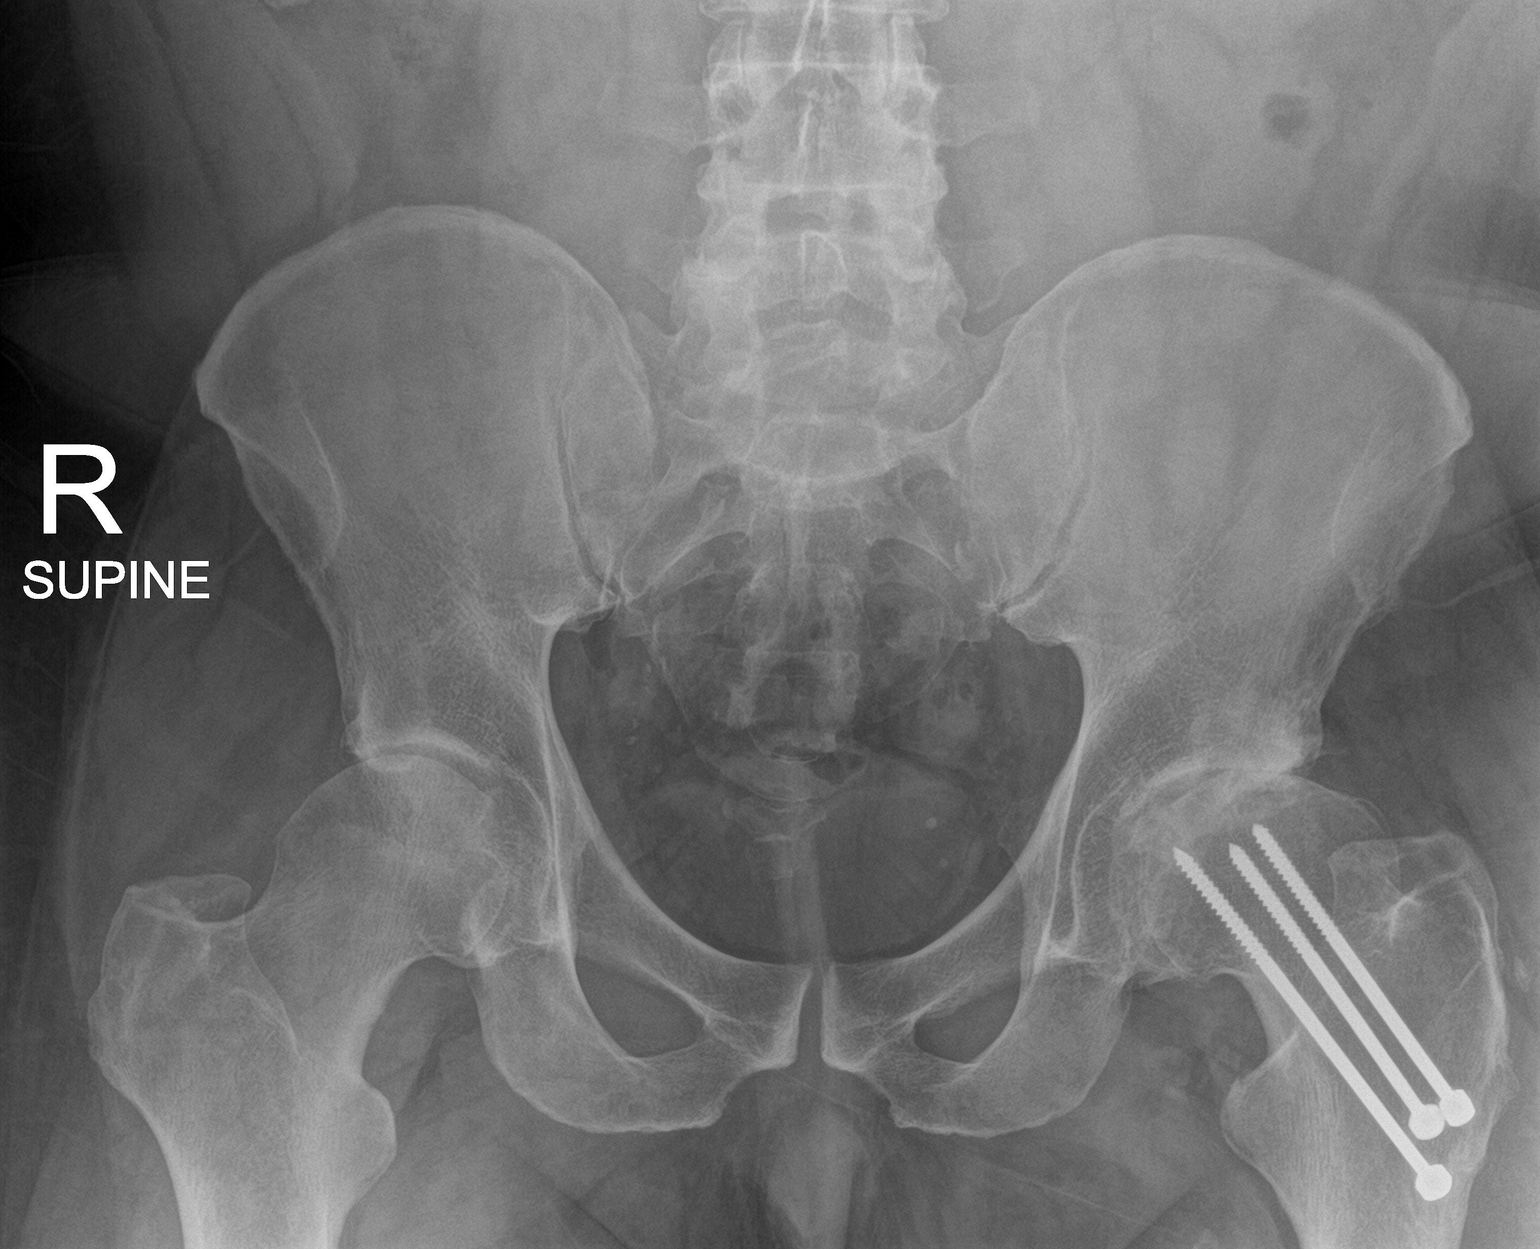

[hip ap]
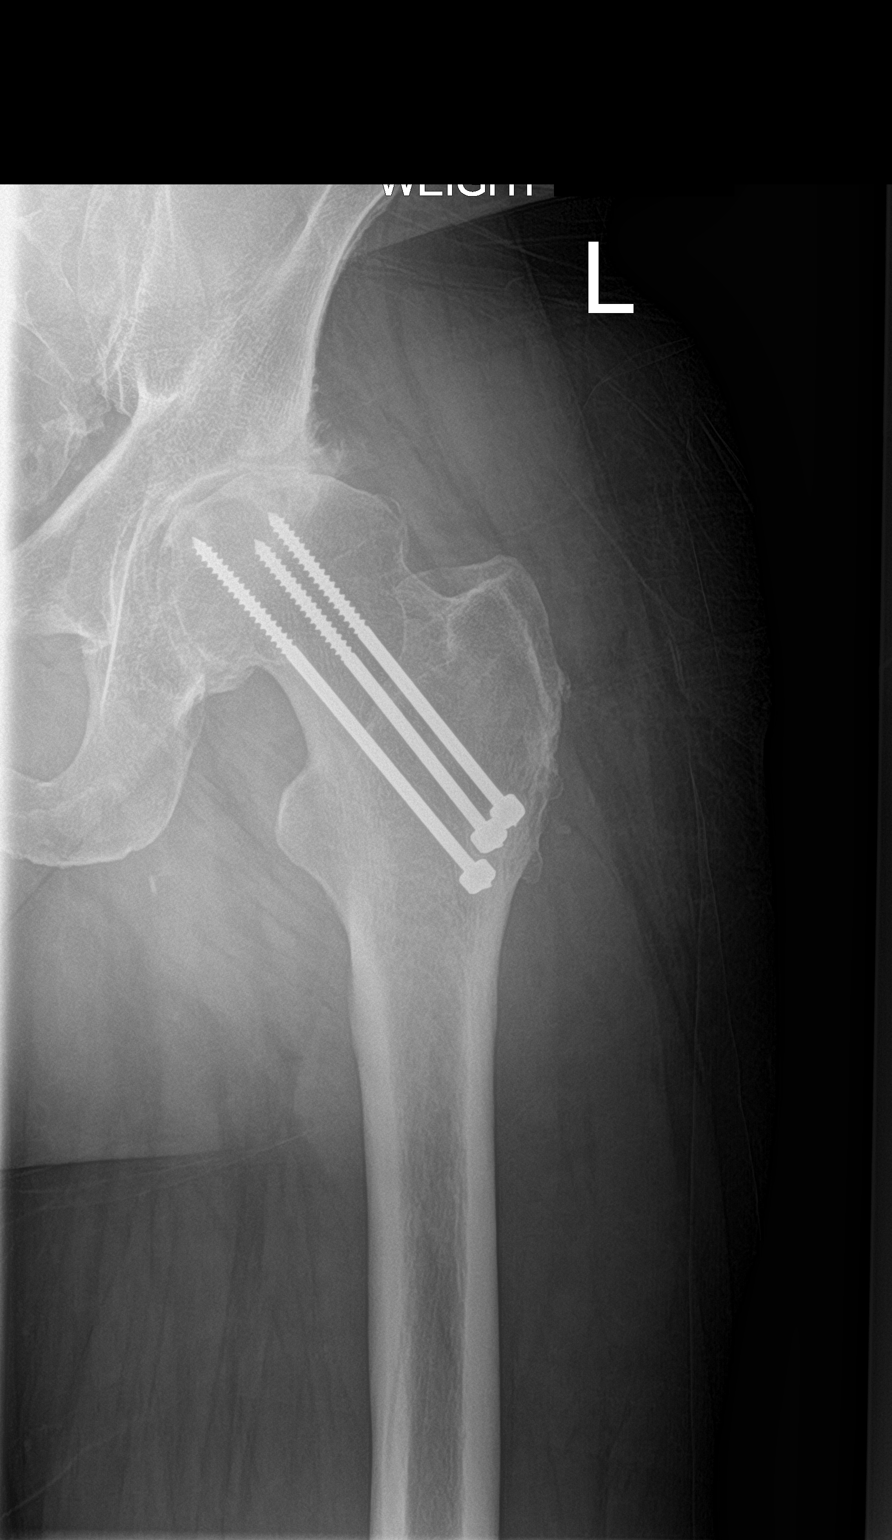

[hip lat]
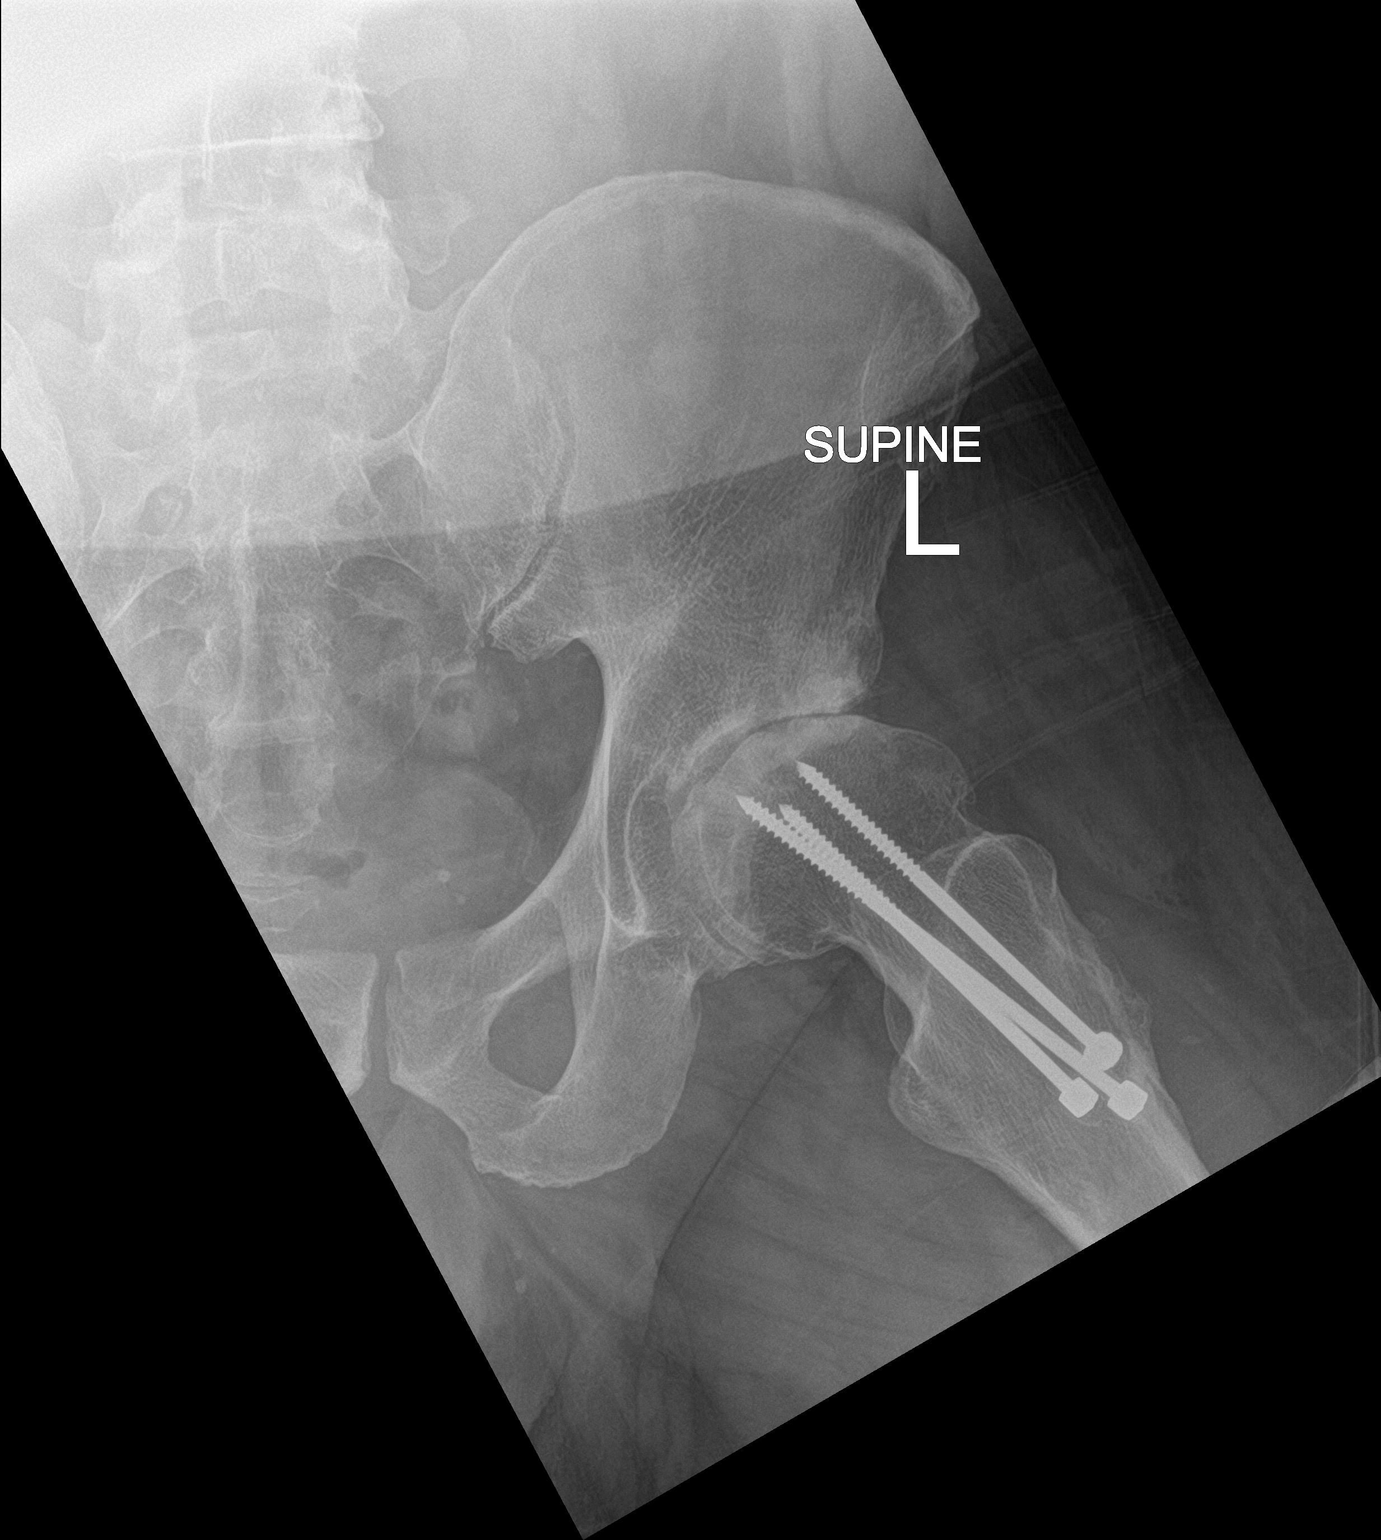

[3 of 3 positions shown; findings below may reference images not displayed]

FINDINGS: Screws are noted within the left femoral neck. Advanced degenerative
changes within the left hip with joint space loss, subchondral
sclerosis and osteophyte formation. Joint space narrowing noted in
the right hip joint. No acute bony abnormality. Specifically, no
fracture, subluxation, or dislocation. Soft tissues are intact. SI
joints are symmetric and unremarkable.
IMPRESSION: Advanced degenerative changes within the left hip. Mild degenerative
changes in the right hip. No acute findings.

## 2019-02-03 ENCOUNTER — Encounter: Payer: Self-pay | Admitting: Internal Medicine
